# Patient Record
Sex: Female | Born: 1970 | Race: White | Hispanic: No | Marital: Married | Smoking: Never smoker
Health system: Southern US, Community
[De-identification: ages and names within clinical notes are randomized; demographics above are authoritative.]

## PROBLEM LIST (undated history)

## (undated) DIAGNOSIS — I10 Essential (primary) hypertension: Secondary | ICD-10-CM

## (undated) DIAGNOSIS — F32A Depression, unspecified: Secondary | ICD-10-CM

## (undated) DIAGNOSIS — E079 Disorder of thyroid, unspecified: Secondary | ICD-10-CM

## (undated) DIAGNOSIS — F329 Major depressive disorder, single episode, unspecified: Secondary | ICD-10-CM

## (undated) HISTORY — PX: TONSILLECTOMY: SUR1361

---

## 2005-03-14 ENCOUNTER — Emergency Department: Payer: Self-pay | Admitting: Emergency Medicine

## 2008-04-01 ENCOUNTER — Ambulatory Visit: Payer: Self-pay | Admitting: Obstetrics and Gynecology

## 2009-08-06 ENCOUNTER — Emergency Department: Payer: Self-pay | Admitting: Emergency Medicine

## 2012-08-28 ENCOUNTER — Emergency Department: Payer: Self-pay | Admitting: Emergency Medicine

## 2012-08-28 LAB — BASIC METABOLIC PANEL
BUN: 12 mg/dL (ref 7–18)
Creatinine: 0.72 mg/dL (ref 0.60–1.30)
EGFR (African American): >60
EGFR (Non-African Amer.): >60
Glucose: 103 mg/dL — ABNORMAL HIGH (ref 65–99)
Osmolality: 281 (ref 275–301)
Potassium: 3.4 mmol/L — ABNORMAL LOW (ref 3.5–5.1)
Sodium: 141 mmol/L (ref 136–145)

## 2012-08-28 LAB — CBC
HCT: 40.3 % (ref 35.0–47.0)
MCHC: 33.8 g/dL (ref 32.0–36.0)
RDW: 13 % (ref 11.5–14.5)
WBC: 9.3 10*3/uL (ref 3.6–11.0)

## 2012-11-30 ENCOUNTER — Ambulatory Visit: Payer: Self-pay | Admitting: Internal Medicine

## 2013-05-01 ENCOUNTER — Ambulatory Visit: Payer: Self-pay | Admitting: Obstetrics and Gynecology

## 2014-05-15 ENCOUNTER — Ambulatory Visit: Payer: Self-pay

## 2015-04-23 ENCOUNTER — Other Ambulatory Visit: Payer: Self-pay | Admitting: Obstetrics and Gynecology

## 2015-04-23 DIAGNOSIS — Z1231 Encounter for screening mammogram for malignant neoplasm of breast: Secondary | ICD-10-CM

## 2015-05-18 ENCOUNTER — Ambulatory Visit
Admission: RE | Admit: 2015-05-18 | Discharge: 2015-05-18 | Disposition: A | Discharge status: Home / Self Care | Payer: 59 | Source: Ambulatory Visit | Attending: Obstetrics and Gynecology | Admitting: Obstetrics and Gynecology

## 2015-05-18 DIAGNOSIS — Z1231 Encounter for screening mammogram for malignant neoplasm of breast: Secondary | ICD-10-CM | POA: Insufficient documentation

## 2016-03-16 ENCOUNTER — Emergency Department
Admission: EM | Admit: 2016-03-16 | Discharge: 2016-03-16 | Disposition: A | Discharge status: Home / Self Care | Payer: 59 | Attending: Emergency Medicine | Admitting: Emergency Medicine

## 2016-03-16 ENCOUNTER — Emergency Department: Payer: 59

## 2016-03-16 ENCOUNTER — Encounter: Payer: Self-pay | Admitting: Emergency Medicine

## 2016-03-16 DIAGNOSIS — K625 Hemorrhage of anus and rectum: Secondary | ICD-10-CM | POA: Diagnosis present

## 2016-03-16 DIAGNOSIS — F329 Major depressive disorder, single episode, unspecified: Secondary | ICD-10-CM | POA: Diagnosis not present

## 2016-03-16 DIAGNOSIS — K529 Noninfective gastroenteritis and colitis, unspecified: Secondary | ICD-10-CM | POA: Insufficient documentation

## 2016-03-16 DIAGNOSIS — I10 Essential (primary) hypertension: Secondary | ICD-10-CM | POA: Insufficient documentation

## 2016-03-16 HISTORY — DX: Disorder of thyroid, unspecified: E07.9

## 2016-03-16 HISTORY — DX: Essential (primary) hypertension: I10

## 2016-03-16 HISTORY — DX: Major depressive disorder, single episode, unspecified: F32.9

## 2016-03-16 HISTORY — DX: Depression, unspecified: F32.A

## 2016-03-16 LAB — COMPREHENSIVE METABOLIC PANEL
ALT: 24 U/L (ref 14–54)
AST: 22 U/L (ref 15–41)
Albumin: 4.3 g/dL (ref 3.5–5.0)
Alkaline Phosphatase: 51 U/L (ref 38–126)
Anion gap: 7 (ref 5–15)
BUN: 15 mg/dL (ref 6–20)
CHLORIDE: 104 mmol/L (ref 101–111)
CO2: 27 mmol/L (ref 22–32)
Calcium: 9 mg/dL (ref 8.9–10.3)
Creatinine, Ser: 0.85 mg/dL (ref 0.44–1.00)
GFR calc Af Amer: >60 mL/min (ref >60)
Glucose, Bld: 130 mg/dL — ABNORMAL HIGH (ref 65–99)
POTASSIUM: 3.1 mmol/L — AB (ref 3.5–5.1)
SODIUM: 138 mmol/L (ref 135–145)
Total Bilirubin: 1.1 mg/dL (ref 0.3–1.2)
Total Protein: 7.7 g/dL (ref 6.5–8.1)

## 2016-03-16 LAB — CBC
HEMATOCRIT: 43.4 % (ref 35.0–47.0)
Hemoglobin: 14.5 g/dL (ref 12.0–16.0)
MCH: 28.3 pg (ref 26.0–34.0)
MCHC: 33.3 g/dL (ref 32.0–36.0)
MCV: 85 fL (ref 80.0–100.0)
Platelets: 205 10*3/uL (ref 150–440)
RBC: 5.11 MIL/uL (ref 3.80–5.20)
RDW: 13.2 % (ref 11.5–14.5)
WBC: 11.2 10*3/uL — AB (ref 3.6–11.0)

## 2016-03-16 LAB — TYPE AND SCREEN
ABO/RH(D): O NEG
ANTIBODY SCREEN: NEGATIVE

## 2016-03-16 LAB — ABO/RH: ABO/RH(D): O NEG

## 2016-03-16 MED ORDER — METRONIDAZOLE 500 MG PO TABS: 500.0000 mg | ORAL_TABLET | Freq: Three times a day (TID) | ORAL | Status: AC

## 2016-03-16 MED ORDER — IOPAMIDOL (ISOVUE-300) INJECTION 61%
100.0000 mL | Freq: Once | INTRAVENOUS | Status: AC | PRN
  Administered 2016-03-16: 100 mL via INTRAVENOUS

## 2016-03-16 MED ORDER — CIPROFLOXACIN HCL 500 MG PO TABS: 500.0000 mg | ORAL_TABLET | Freq: Two times a day (BID) | ORAL | Status: AC

## 2016-03-16 MED ORDER — DIATRIZOATE MEGLUMINE & SODIUM 66-10 % PO SOLN
15.0000 mL | Freq: Once | ORAL | Status: AC
  Administered 2016-03-16: 15 mL via ORAL

## 2016-03-16 NOTE — Discharge Instructions (Signed)
Please seek medical attention for any high fevers, chest pain, shortness of breath, change in behavior, persistent vomiting, bloody stool or any other new or concerning symptoms.   Colitis Colitis is inflammation of the colon. Colitis may last a short time (acute) or it may last a long time (chronic). CAUSES This condition may be caused by:  Viruses.  Bacteria.  Reactions to medicine.  Certain autoimmune diseases, such as Crohn disease or ulcerative colitis. SYMPTOMS Symptoms of this condition include:  Diarrhea.  Passing bloody or tarry stool.  Pain.  Fever.  Vomiting.  Tiredness (fatigue).  Weight loss.  Bloating.  Sudden increase in abdominal pain.  Having fewer bowel movements than usual. DIAGNOSIS This condition is diagnosed with a stool test or a blood test. You may also have other tests, including X-rays, a CT scan, or a colonoscopy. TREATMENT Treatment may include:  Resting the bowel. This involves not eating or drinking for a period of time.  Fluids that are given through an IV tube.  Medicine for pain and diarrhea.  Antibiotic medicines.  Cortisone medicines.  Surgery. HOME CARE INSTRUCTIONS Eating and Drinking  Follow instructions from your health care provider about eating or drinking restrictions.  Drink enough fluid to keep your urine clear or pale yellow.  Work with a dietitian to determine which foods cause your condition to flare up.  Avoid foods that cause flare-ups.  Eat a well-balanced diet. Medicines  Take over-the-counter and prescription medicines only as told by your health care provider.  If you were prescribed an antibiotic medicine, take it as told by your health care provider. Do not stop taking the antibiotic even if you start to feel better. General Instructions  Keep all follow-up visits as told by your health care provider. This is important. SEEK MEDICAL CARE IF:  Your symptoms do not go away.  You develop  new symptoms. SEEK IMMEDIATE MEDICAL CARE IF:  You have a fever that does not go away with treatment.  You develop chills.  You have extreme weakness, fainting, or dehydration.  You have repeated vomiting.  You develop severe pain in your abdomen.  You pass bloody or tarry stool.   This information is not intended to replace advice given to you by your health care provider. Make sure you discuss any questions you have with your health care provider.   Document Released: 11/24/2004 Document Revised: 07/08/2015 Document Reviewed: 02/09/2015 Elsevier Interactive Patient Education [employer].

## 2016-03-16 NOTE — ED Notes (Signed)
Pt presents to ED with reports of rectal bleeding that started this morning. Pt reports had diarrhea yesterday. Pt denies diarrhea today. Pt reports feels abdominal cramping and feels like she is going to have a bowel movement but when she goes to the toilet she is passing blood and blood clots.

## 2016-03-16 NOTE — ED Provider Notes (Signed)
[facility] Emergency Department Provider Note    ____________________________________________  Time seen: ~2135  I have reviewed the triage vital signs and the nursing notes.   HISTORY  Chief Complaint Rectal Bleeding   History limited by: Not Limited   HPI Joanna Ford is a 44 y.o. female who presents to the emergency department today because of concerns for GI bleeding and diarrhea as well as abdominal pain. Patient states that the symptoms started in the middle of last night. She states she woke up with abdominal cramping. She describes it as being located in the lower part of her abdomen. It is also somewhat slightly more on the left lower quadrant. The patient states that after waking up she did have diarrhea. She states that she noticed some blood in the diarrhea. Since that time he has had multiple episodes of diarrhea and has passed some blood clots. She describes it as being red blood. She is continuing with some abdominal pain. Patient denies similar symptoms in the past. Denies any fevers. Denies any history of colonoscopy. No family history of intestinal issues. No recent travel. No abnormal ingestion. No untreated water.   Past Medical History  Diagnosis Date  . Thyroid disease   . Hypertension   . Depression     There are no active problems to display for this patient.   Past Surgical History  Procedure Laterality Date  . Tonsillectomy      No current outpatient prescriptions on file.  Allergies Review of patient's allergies indicates no known allergies.  No family history on file.  Social History Social History  Substance Use Topics  . Smoking status: Never Smoker   . Smokeless tobacco: None  . Alcohol Use: No    Review of Systems  Constitutional: Negative for fever. Cardiovascular: Negative for chest pain. Respiratory: Negative for shortness of breath. Gastrointestinal: Negative for abdominal pain, vomiting and  diarrhea. Genitourinary: Negative for dysuria. Musculoskeletal: Negative for back pain. Skin: Negative for rash. Neurological: Negative for headaches, focal weakness or numbness.  10-point ROS otherwise negative.  ____________________________________________   PHYSICAL EXAM:  VITAL SIGNS: ED Triage Vitals  Enc Vitals Group     BP 03/16/16 1802 138/67 mmHg     Pulse Rate 03/16/16 1802 88     Resp 03/16/16 1802 18     Temp 03/16/16 1802 98.5 F (36.9 C)     Temp Source 03/16/16 1802 Oral     SpO2 03/16/16 1802 100 %     Weight 03/16/16 1802 202 lb (91.627 kg)     Height 03/16/16 1802 5\' 5"  (1.651 m)   Constitutional: Alert and oriented. Well appearing and in no distress. Eyes: Conjunctivae are normal. PERRL. Normal extraocular movements. ENT   Head: Normocephalic and atraumatic.   Nose: No congestion/rhinnorhea.   Mouth/Throat: Mucous membranes are moist.   Neck: No stridor. Hematological/Lymphatic/Immunilogical: No cervical lymphadenopathy. Cardiovascular: Normal rate, regular rhythm.  No murmurs, rubs, or gallops. Respiratory: Normal respiratory effort without tachypnea nor retractions. Breath sounds are clear and equal bilaterally. No wheezes/rales/rhonchi. Gastrointestinal: Soft and tender to palpation in the left lower quadrant. Genitourinary: Deferred Musculoskeletal: Normal range of motion in all extremities. No joint effusions.  No lower extremity tenderness nor edema. Neurologic:  Normal speech and language. No gross focal neurologic deficits are appreciated.  Skin:  Skin is warm, dry and intact. No rash noted. Psychiatric: Mood and affect are normal. Speech and behavior are normal. Patient exhibits appropriate insight and judgment.  ____________________________________________  LABS (pertinent positives/negatives)  Labs Reviewed  COMPREHENSIVE METABOLIC PANEL - Abnormal; Notable for the following:    Potassium 3.1 (*)    Glucose, Bld 130 (*)     All other components within normal limits  CBC - Abnormal; Notable for the following:    WBC 11.2 (*)    All other components within normal limits  POC OCCULT BLOOD, ED  TYPE AND SCREEN  ABO/RH     ____________________________________________   EKG  None  ____________________________________________    RADIOLOGY  CT abd/pel  IMPRESSION: Colitis of the descending colon. No abscess.   ____________________________________________   PROCEDURES  Procedure(s) performed: None  Critical Care performed: No  ____________________________________________   INITIAL IMPRESSION / ASSESSMENT AND PLAN / ED COURSE  Pertinent labs & imaging results that were available during my care of the patient were reviewed by me and considered in my medical decision making (see chart for details).  Patient presented to the emergency department today because of concerns for diarrhea and bloody diarrhea. Patient also had some tenderness in the left lower quadrant. It shows mild leukocytosis on blood work. Because of this a CT was obtained to evaluate for possible diverticulitis. CT scan does show colitis. Will plan on placing patient on antibiotics. Discussed findings with patient.  ____________________________________________   FINAL CLINICAL IMPRESSION(S) / ED DIAGNOSES  Final diagnoses:  Colitis     Nance Pear, MD 03/16/16 2329

## 2016-04-27 ENCOUNTER — Other Ambulatory Visit: Payer: Self-pay | Admitting: Obstetrics and Gynecology

## 2016-04-27 DIAGNOSIS — Z1231 Encounter for screening mammogram for malignant neoplasm of breast: Secondary | ICD-10-CM

## 2016-05-06 ENCOUNTER — Ambulatory Visit: Payer: 59

## 2016-05-18 ENCOUNTER — Other Ambulatory Visit: Payer: Self-pay | Admitting: Obstetrics and Gynecology

## 2016-05-18 ENCOUNTER — Ambulatory Visit
Admission: RE | Admit: 2016-05-18 | Discharge: 2016-05-18 | Disposition: A | Discharge status: Home / Self Care | Payer: 59 | Source: Ambulatory Visit | Attending: Obstetrics and Gynecology | Admitting: Obstetrics and Gynecology

## 2016-05-18 DIAGNOSIS — Z1231 Encounter for screening mammogram for malignant neoplasm of breast: Secondary | ICD-10-CM

## 2017-05-01 ENCOUNTER — Other Ambulatory Visit: Payer: Self-pay | Admitting: Obstetrics and Gynecology

## 2017-05-01 DIAGNOSIS — Z1231 Encounter for screening mammogram for malignant neoplasm of breast: Secondary | ICD-10-CM

## 2017-05-24 ENCOUNTER — Ambulatory Visit
Admission: RE | Admit: 2017-05-24 | Discharge: 2017-05-24 | Disposition: A | Discharge status: Home / Self Care | Payer: 59 | Source: Ambulatory Visit | Attending: Obstetrics and Gynecology | Admitting: Obstetrics and Gynecology

## 2017-05-24 DIAGNOSIS — Z1231 Encounter for screening mammogram for malignant neoplasm of breast: Secondary | ICD-10-CM

## 2017-05-25 IMAGING — CT CT ABD-PELV W/ CM
2 of 5 series · 16 of 46 positions shown, 18 images · IV contrast (iopamidol)
Comparison: None.

CLINICAL DATA: 44-year-old female with left lower quadrant
abdominal pain and rectal bleeding

EXAM:
CT ABDOMEN AND PELVIS WITH CONTRAST
TECHNIQUE: Multidetector CT imaging of the abdomen and pelvis was performed
using the standard protocol following bolus administration of
intravenous contrast.
CONTRAST:  100mL 5GG31A-QWW IOPAMIDOL (5GG31A-QWW) INJECTION 61%

[Series 2: routine abd pel with · axial · 0.80mm/px · z∈[-942,-532]mm · 13 of 92 slices shown, 15 images]
[im 5/92  soft-tissue]
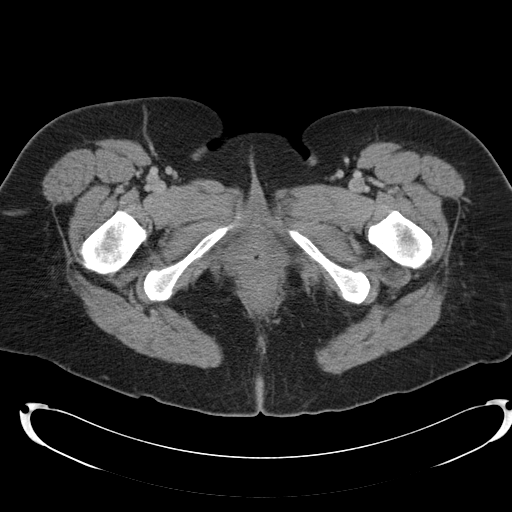
[im 5/92  bone]
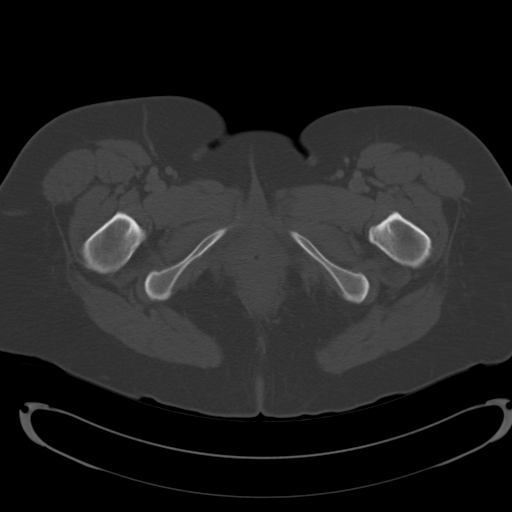
[im 15/92  soft-tissue]
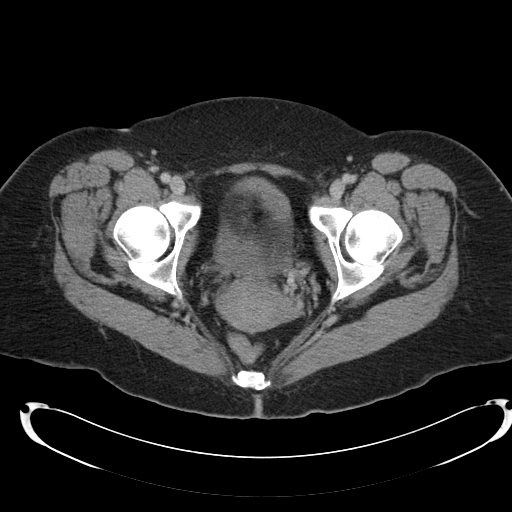
[im 20/92  soft-tissue]
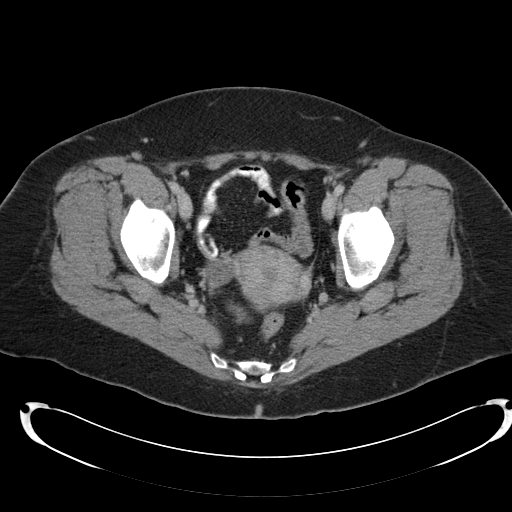
[im 24/92  soft-tissue]
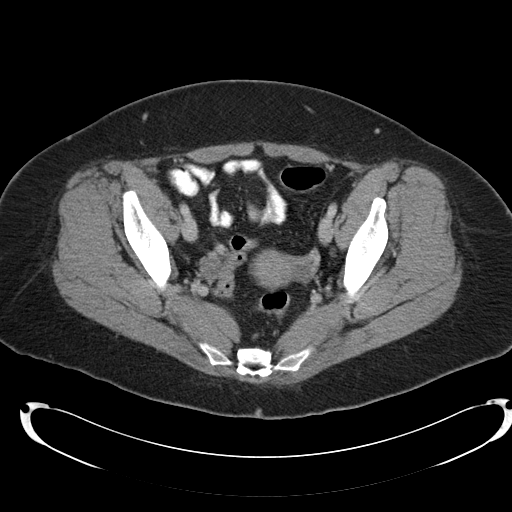
[im 34/92  soft-tissue]
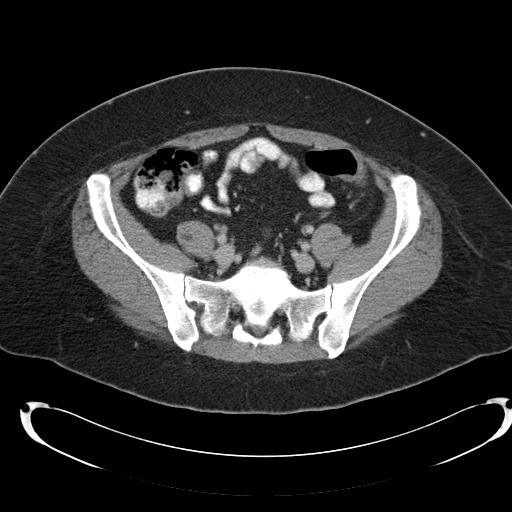
[im 39/92  soft-tissue]
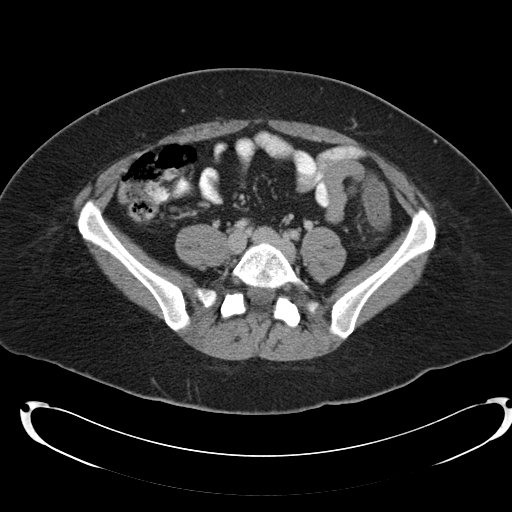
[im 48/92  soft-tissue]
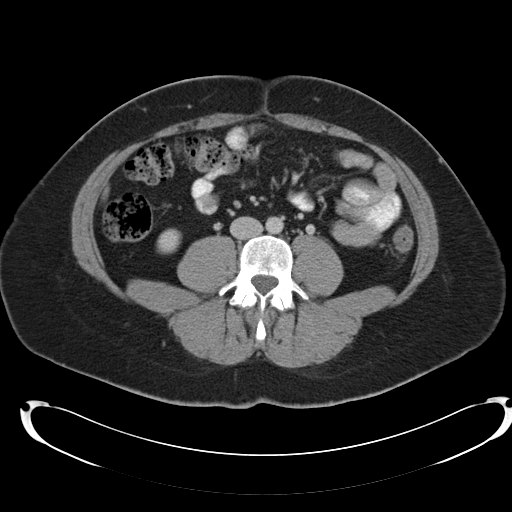
[im 53/92  soft-tissue]
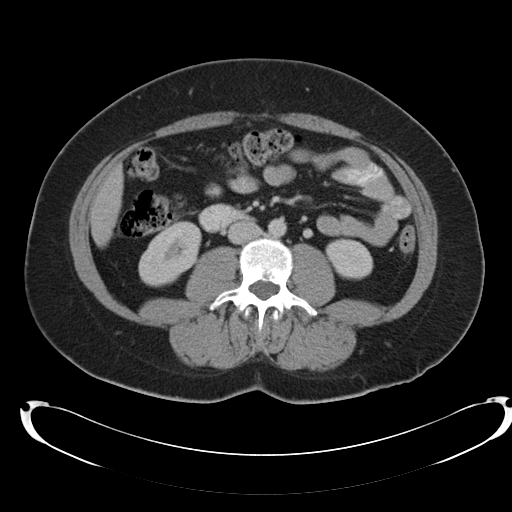
[im 58/92  soft-tissue]
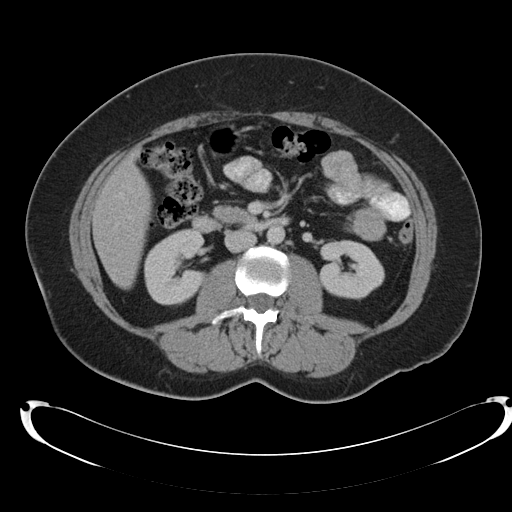
[im 58/92  bone]
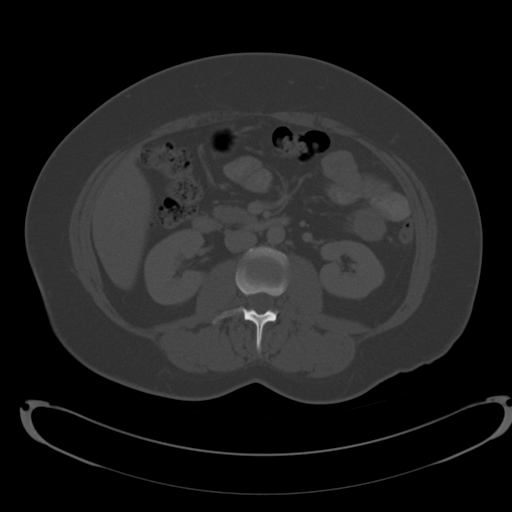
[im 68/92  soft-tissue]
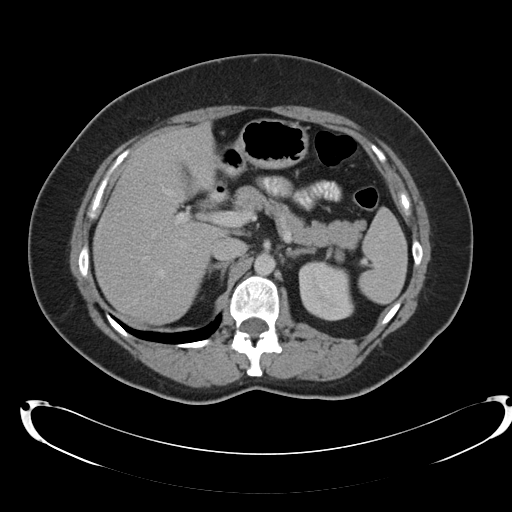
[im 72/92  soft-tissue]
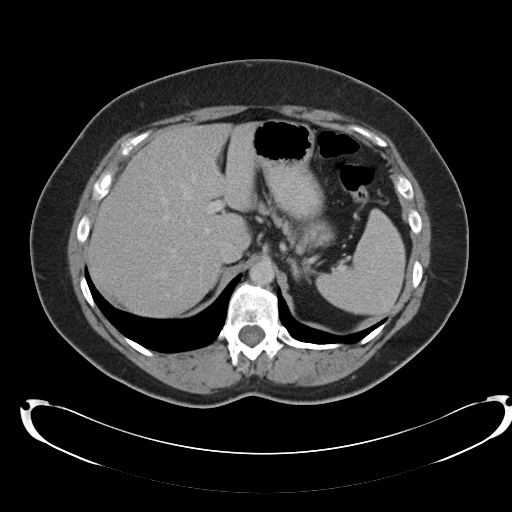
[im 77/92  soft-tissue]
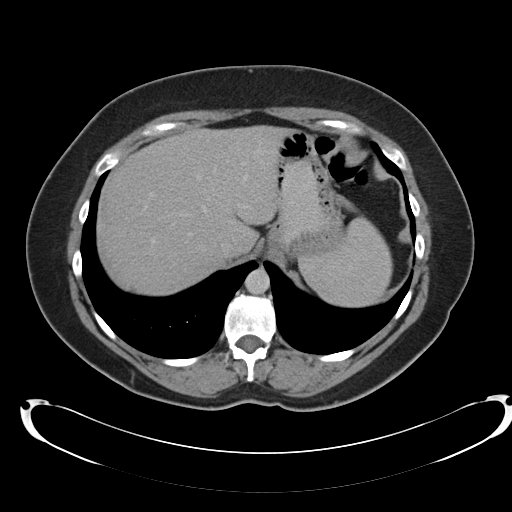
[im 87/92  soft-tissue]
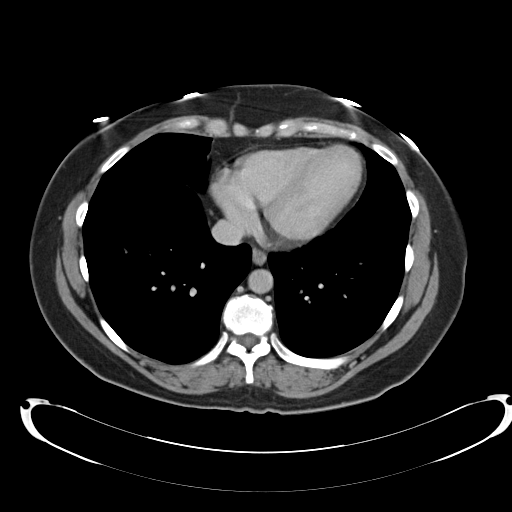

[Series 5: cor routine abd pel with · coronal · 0.77mm/px · 3 of 137 slices shown]
[im 46/137  soft-tissue]
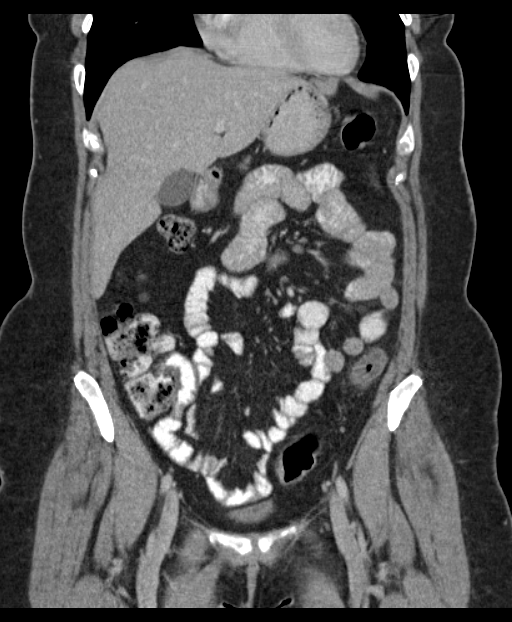
[im 61/137  soft-tissue]
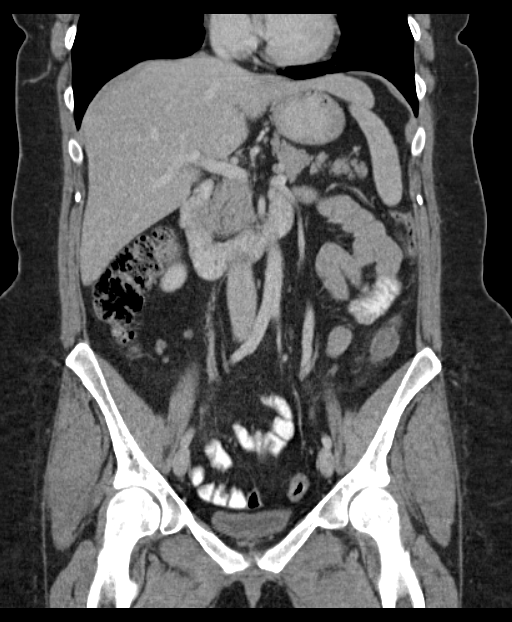
[im 76/137  soft-tissue]
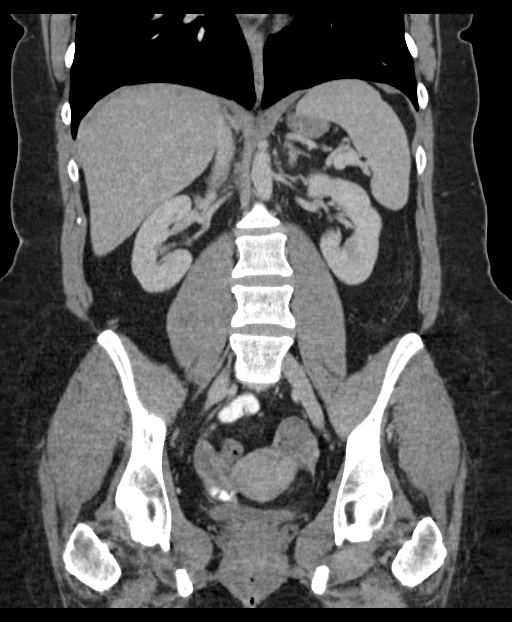

[16 of 46 positions shown; findings below may reference images not displayed]

FINDINGS: The visualized lung bases are clear. No intra-abdominal free air or
free fluid.

The liver, gallbladder, pancreas, spleen, adrenal glands, kidneys,
visualized ureters appear unremarkable. The urinary bladder is
collapsed. There is apparent diffuse thickening of the bladder wall
which may be partly related to underdistention. Cystitis is not
excluded. Correlation with urinalysis recommended. The uterus and
ovaries are grossly unremarkable.

There is circumferential thickening of the mid and distal portion of
the descending colon compatible with colitis. Correlation with stool
cultures recommended. Moderate stool noted in the proximal colon.
There is no evidence of bowel obstruction. Normal appendix.

The abdominal aorta and IVC appear unremarkable. The origins of the
celiac axis, SMA, IMA as well as the origins of the renal arteries
are patent. No portal venous gas identified. There is no adenopathy.
There is a midline vertical anterior abdominal wall incisional scar
and a small fat containing umbilical hernia. The abdominal wall soft
tissues are otherwise unremarkable. The osseous structures are
intact.
IMPRESSION: Colitis of the descending colon.  No abscess.

## 2018-05-02 ENCOUNTER — Other Ambulatory Visit: Payer: Self-pay | Admitting: Obstetrics and Gynecology

## 2018-05-02 DIAGNOSIS — Z1231 Encounter for screening mammogram for malignant neoplasm of breast: Secondary | ICD-10-CM

## 2018-05-30 ENCOUNTER — Ambulatory Visit
Admission: RE | Admit: 2018-05-30 | Discharge: 2018-05-30 | Disposition: A | Discharge status: Home / Self Care | Payer: BLUE CROSS/BLUE SHIELD | Source: Ambulatory Visit | Attending: Obstetrics and Gynecology | Admitting: Obstetrics and Gynecology

## 2018-05-30 DIAGNOSIS — Z1231 Encounter for screening mammogram for malignant neoplasm of breast: Secondary | ICD-10-CM | POA: Diagnosis not present

## 2019-05-06 ENCOUNTER — Other Ambulatory Visit: Payer: Self-pay | Admitting: Family Medicine

## 2019-05-06 ENCOUNTER — Other Ambulatory Visit: Payer: Self-pay | Admitting: Obstetrics and Gynecology

## 2019-05-06 DIAGNOSIS — Z1231 Encounter for screening mammogram for malignant neoplasm of breast: Secondary | ICD-10-CM

## 2019-05-29 ENCOUNTER — Other Ambulatory Visit: Payer: Self-pay | Admitting: Internal Medicine

## 2019-05-29 DIAGNOSIS — R10811 Right upper quadrant abdominal tenderness: Secondary | ICD-10-CM

## 2019-06-05 ENCOUNTER — Other Ambulatory Visit: Payer: Self-pay

## 2019-06-05 ENCOUNTER — Ambulatory Visit
Admission: RE | Admit: 2019-06-05 | Discharge: 2019-06-05 | Disposition: A | Discharge status: Home / Self Care | Payer: BC Managed Care – PPO | Source: Ambulatory Visit | Attending: Internal Medicine | Admitting: Internal Medicine

## 2019-06-05 DIAGNOSIS — R10811 Right upper quadrant abdominal tenderness: Secondary | ICD-10-CM | POA: Insufficient documentation

## 2019-06-13 ENCOUNTER — Ambulatory Visit
Admission: RE | Admit: 2019-06-13 | Discharge: 2019-06-13 | Disposition: A | Discharge status: Home / Self Care | Payer: BC Managed Care – PPO | Source: Ambulatory Visit | Attending: Obstetrics and Gynecology | Admitting: Obstetrics and Gynecology

## 2019-06-13 DIAGNOSIS — Z1231 Encounter for screening mammogram for malignant neoplasm of breast: Secondary | ICD-10-CM | POA: Diagnosis not present

## 2019-06-14 ENCOUNTER — Other Ambulatory Visit: Payer: Self-pay | Admitting: Internal Medicine

## 2019-06-14 DIAGNOSIS — R10811 Right upper quadrant abdominal tenderness: Secondary | ICD-10-CM

## 2019-06-20 ENCOUNTER — Encounter
Admission: RE | Admit: 2019-06-20 | Discharge: 2019-06-20 | Disposition: A | Discharge status: Home / Self Care | Payer: BC Managed Care – PPO | Source: Ambulatory Visit | Attending: Internal Medicine | Admitting: Internal Medicine

## 2019-06-20 ENCOUNTER — Other Ambulatory Visit: Payer: Self-pay

## 2019-06-20 DIAGNOSIS — R10811 Right upper quadrant abdominal tenderness: Secondary | ICD-10-CM | POA: Insufficient documentation

## 2019-06-20 MED ORDER — TECHNETIUM TC 99M MEBROFENIN IV KIT
5.0000 | PACK | Freq: Once | INTRAVENOUS | Status: AC | PRN
  Administered 2019-06-20: 08:00:00 5.52 via INTRAVENOUS

## 2019-09-20 ENCOUNTER — Emergency Department: Payer: BC Managed Care – PPO

## 2019-09-20 ENCOUNTER — Other Ambulatory Visit: Payer: Self-pay

## 2019-09-20 DIAGNOSIS — W230XXA Caught, crushed, jammed, or pinched between moving objects, initial encounter: Secondary | ICD-10-CM | POA: Diagnosis not present

## 2019-09-20 DIAGNOSIS — I10 Essential (primary) hypertension: Secondary | ICD-10-CM | POA: Diagnosis not present

## 2019-09-20 DIAGNOSIS — Y9289 Other specified places as the place of occurrence of the external cause: Secondary | ICD-10-CM | POA: Diagnosis not present

## 2019-09-20 DIAGNOSIS — S6991XA Unspecified injury of right wrist, hand and finger(s), initial encounter: Secondary | ICD-10-CM | POA: Insufficient documentation

## 2019-09-20 DIAGNOSIS — S60111A Contusion of right thumb with damage to nail, initial encounter: Secondary | ICD-10-CM | POA: Diagnosis not present

## 2019-09-20 DIAGNOSIS — Y999 Unspecified external cause status: Secondary | ICD-10-CM | POA: Insufficient documentation

## 2019-09-20 DIAGNOSIS — Y9389 Activity, other specified: Secondary | ICD-10-CM | POA: Insufficient documentation

## 2019-09-20 NOTE — ED Triage Notes (Signed)
Patient reports she slammed her right hand in car door. Bleeding seen to base of thumbnail. Bruising seen to distal thumb.

## 2019-09-21 ENCOUNTER — Emergency Department
Admission: EM | Admit: 2019-09-21 | Discharge: 2019-09-21 | Disposition: A | Discharge status: Home / Self Care | Payer: BC Managed Care – PPO | Attending: Emergency Medicine | Admitting: Emergency Medicine

## 2019-09-21 DIAGNOSIS — S6991XA Unspecified injury of right wrist, hand and finger(s), initial encounter: Secondary | ICD-10-CM

## 2019-09-21 MED ORDER — IBUPROFEN 600 MG PO TABS: 600.0000 mg | ORAL_TABLET | Freq: Four times a day (QID) | ORAL | 0 refills | Status: DC | PRN

## 2019-09-21 NOTE — ED Notes (Signed)
Patient states that she slammed her right thumb in a car door tonight. Patient with bruising and swelling noted thumb. Patient with blood noted to the bottom of nail with bleeding controlled at this time.

## 2019-09-21 NOTE — ED Provider Notes (Signed)
[facility] Emergency Department Provider Note   ____________________________________________   I have reviewed the triage vital signs and the nursing notes.   HISTORY  Chief Complaint Finger Injury   History limited by: Not Limited   HPI SUE-ELLEN MELMS is a 48 y.o. female who presents to the emergency department today because of concern for right thumb injury. She got her finger slammed in a car door. She denies any other injury. Has had swelling to that thumb as well as decreased ROM. She is right handed.    Records reviewed. Per medical record review patient has a history of HTN  Past Medical History:  Diagnosis Date  . Depression   . Hypertension   . Thyroid disease     There are no active problems to display for this patient.   Past Surgical History:  Procedure Laterality Date  . TONSILLECTOMY      Prior to Admission medications   Not on File    Allergies Patient has no known allergies.  Family History  Problem Relation Age of Onset  . Breast cancer Neg Hx     Social History Social History   Tobacco Use  . Smoking status: Never Smoker  . Smokeless tobacco: Never Used  Substance Use Topics  . Alcohol use: No  . Drug use: No    Review of Systems Constitutional: No fever/chills Eyes: No visual changes. ENT: No sore throat. Cardiovascular: Denies chest pain. Respiratory: Denies shortness of breath. Gastrointestinal: No abdominal pain.  No nausea, no vomiting.  No diarrhea.   Genitourinary: Negative for dysuria. Musculoskeletal: Positive for right thumb pain and swelling.  Skin: Bruising to right thumb.  Neurological: Negative for headaches, focal weakness or numbness.  ____________________________________________   PHYSICAL EXAM:  VITAL SIGNS: ED Triage Vitals [09/20/19 2339]  Enc Vitals Group     BP (!) 152/74     Pulse Rate 83     Resp 17     Temp 98.7 F (37.1 C)     Temp Source Oral     SpO2 97 %   Weight 213 lb (96.6 kg)     Height 5\' 6"  (1.676 m)     Head Circumference      Peak Flow      Pain Score 6   Constitutional: Alert and oriented.  Eyes: Conjunctivae are normal.  ENT      Head: Normocephalic and atraumatic.      Nose: No congestion/rhinnorhea.      Mouth/Throat: Mucous membranes are moist.      Neck: No stridor. Cardiovascular: Normal rate, regular rhythm.  No murmurs, rubs, or gallops.  Respiratory: Normal respiratory effort without tachypnea nor retractions. Breath sounds are clear and equal bilaterally. No wheezes/rales/rhonchi. Gastrointestinal: Soft and non tender. No rebound. No guarding.  Genitourinary: Deferred Musculoskeletal: Right thumb with swelling and bruising. Small subungual hematoma. Small laceration to nail fold Neurologic:  Normal speech and language. No gross focal neurologic deficits are appreciated.  Skin:  Skin is warm, dry and intact. No rash noted. Psychiatric: Mood and affect are normal. Speech and behavior are normal. Patient exhibits appropriate insight and judgment.  ____________________________________________    LABS (pertinent positives/negatives)  None  ____________________________________________   EKG  None  ____________________________________________    RADIOLOGY  Right thumb No acute osseous injury  ____________________________________________   PROCEDURES  Procedures  ____________________________________________   INITIAL IMPRESSION / ASSESSMENT AND PLAN / ED COURSE  Pertinent labs & imaging results that were available during  my care of the patient were reviewed by me and considered in my medical decision making (see chart for details).   Patient presents to the emergency department because of concern for right thumb injury. X-ray does not show any fracture. Small subungual hematoma. Do not feel there would be significant benefit with trephination, however did discuss it with patient. She felt comfortable  deferring at this time. Small laceration to nail fold not requiring advanced closure. Will discharge home.   ____________________________________________   FINAL CLINICAL IMPRESSION(S) / ED DIAGNOSES  Final diagnoses:  Injury of finger of right hand, initial encounter     Note: This dictation was prepared with Dragon dictation. Any transcriptional errors that result from this process are unintentional     Nance Pear, MD 09/21/19 0401

## 2019-09-21 NOTE — Discharge Instructions (Signed)
Please try to keep your finger clean. The main concern would be for an infection, so if there is increased redness, foul smelling discharge or any pus please be seen.

## 2019-09-21 NOTE — ED Notes (Signed)
ED Provider at bedside. 

## 2020-05-06 ENCOUNTER — Other Ambulatory Visit: Payer: Self-pay | Admitting: Obstetrics and Gynecology

## 2020-05-06 DIAGNOSIS — Z1231 Encounter for screening mammogram for malignant neoplasm of breast: Secondary | ICD-10-CM

## 2020-06-15 ENCOUNTER — Other Ambulatory Visit: Payer: Self-pay

## 2020-06-15 ENCOUNTER — Ambulatory Visit
Admission: RE | Admit: 2020-06-15 | Discharge: 2020-06-15 | Disposition: A | Discharge status: Home / Self Care | Payer: BC Managed Care – PPO | Source: Ambulatory Visit | Attending: Obstetrics and Gynecology | Admitting: Obstetrics and Gynecology

## 2020-06-15 DIAGNOSIS — Z1231 Encounter for screening mammogram for malignant neoplasm of breast: Secondary | ICD-10-CM | POA: Diagnosis present

## 2020-09-23 ENCOUNTER — Other Ambulatory Visit: Payer: Self-pay | Admitting: Family Medicine

## 2020-09-23 DIAGNOSIS — M5441 Lumbago with sciatica, right side: Secondary | ICD-10-CM

## 2020-12-03 ENCOUNTER — Other Ambulatory Visit: Payer: Self-pay | Admitting: Gastroenterology

## 2020-12-03 DIAGNOSIS — R1902 Left upper quadrant abdominal swelling, mass and lump: Secondary | ICD-10-CM

## 2020-12-16 ENCOUNTER — Ambulatory Visit
Admission: RE | Admit: 2020-12-16 | Discharge: 2020-12-16 | Disposition: A | Discharge status: Home / Self Care | Payer: BC Managed Care – PPO | Source: Ambulatory Visit | Attending: Gastroenterology | Admitting: Gastroenterology

## 2020-12-16 ENCOUNTER — Other Ambulatory Visit: Payer: Self-pay

## 2020-12-16 DIAGNOSIS — R1902 Left upper quadrant abdominal swelling, mass and lump: Secondary | ICD-10-CM | POA: Insufficient documentation

## 2020-12-16 LAB — POCT I-STAT CREATININE: Creatinine, Ser: 0.9 mg/dL (ref 0.44–1.00)

## 2020-12-16 MED ORDER — IOHEXOL 300 MG/ML  SOLN
100.0000 mL | Freq: Once | INTRAMUSCULAR | Status: AC | PRN
  Administered 2020-12-16: 100 mL via INTRAVENOUS

## 2021-05-19 ENCOUNTER — Other Ambulatory Visit: Payer: Self-pay | Admitting: Physical Medicine & Rehabilitation

## 2021-05-19 DIAGNOSIS — G8929 Other chronic pain: Secondary | ICD-10-CM

## 2021-05-25 ENCOUNTER — Ambulatory Visit
Admission: RE | Admit: 2021-05-25 | Discharge: 2021-05-25 | Disposition: A | Discharge status: Home / Self Care | Payer: BC Managed Care – PPO | Source: Ambulatory Visit | Attending: Physical Medicine & Rehabilitation | Admitting: Physical Medicine & Rehabilitation

## 2021-05-25 ENCOUNTER — Other Ambulatory Visit: Payer: Self-pay

## 2021-05-25 DIAGNOSIS — G8929 Other chronic pain: Secondary | ICD-10-CM | POA: Insufficient documentation

## 2021-05-25 DIAGNOSIS — M5441 Lumbago with sciatica, right side: Secondary | ICD-10-CM | POA: Insufficient documentation

## 2021-06-30 ENCOUNTER — Other Ambulatory Visit: Payer: Self-pay | Admitting: Obstetrics and Gynecology

## 2021-06-30 DIAGNOSIS — Z1231 Encounter for screening mammogram for malignant neoplasm of breast: Secondary | ICD-10-CM

## 2021-07-16 ENCOUNTER — Other Ambulatory Visit: Payer: Self-pay

## 2021-07-16 ENCOUNTER — Ambulatory Visit
Admission: RE | Admit: 2021-07-16 | Discharge: 2021-07-16 | Disposition: A | Discharge status: Home / Self Care | Payer: BC Managed Care – PPO | Source: Ambulatory Visit | Attending: Obstetrics and Gynecology | Admitting: Obstetrics and Gynecology

## 2021-07-16 DIAGNOSIS — Z1231 Encounter for screening mammogram for malignant neoplasm of breast: Secondary | ICD-10-CM | POA: Insufficient documentation

## 2021-11-16 ENCOUNTER — Other Ambulatory Visit: Payer: Self-pay | Admitting: Neurosurgery

## 2021-11-16 DIAGNOSIS — M5416 Radiculopathy, lumbar region: Secondary | ICD-10-CM

## 2021-11-16 DIAGNOSIS — M7138 Other bursal cyst, other site: Secondary | ICD-10-CM

## 2021-11-25 ENCOUNTER — Ambulatory Visit
Admission: RE | Admit: 2021-11-25 | Discharge: 2021-11-25 | Disposition: A | Discharge status: Home / Self Care | Payer: BC Managed Care – PPO | Source: Ambulatory Visit | Attending: Neurosurgery | Admitting: Neurosurgery

## 2021-11-25 ENCOUNTER — Other Ambulatory Visit: Payer: Self-pay

## 2021-11-25 DIAGNOSIS — M5416 Radiculopathy, lumbar region: Secondary | ICD-10-CM | POA: Diagnosis present

## 2021-11-25 DIAGNOSIS — M7138 Other bursal cyst, other site: Secondary | ICD-10-CM | POA: Diagnosis present

## 2021-12-08 ENCOUNTER — Other Ambulatory Visit: Payer: Self-pay | Admitting: Neurosurgery

## 2021-12-21 ENCOUNTER — Other Ambulatory Visit: Payer: Self-pay

## 2021-12-21 ENCOUNTER — Encounter
Admission: RE | Admit: 2021-12-21 | Discharge: 2021-12-21 | Disposition: A | Discharge status: Home / Self Care | Payer: BC Managed Care – PPO | Source: Ambulatory Visit | Attending: Neurosurgery | Admitting: Neurosurgery

## 2021-12-21 DIAGNOSIS — Z01812 Encounter for preprocedural laboratory examination: Secondary | ICD-10-CM | POA: Insufficient documentation

## 2021-12-21 LAB — TYPE AND SCREEN
ABO/RH(D): O NEG
Antibody Screen: NEGATIVE

## 2021-12-21 LAB — BASIC METABOLIC PANEL
Anion gap: 9 (ref 5–15)
BUN: 15 mg/dL (ref 6–20)
CO2: 26 mmol/L (ref 22–32)
Calcium: 9.1 mg/dL (ref 8.9–10.3)
Chloride: 100 mmol/L (ref 98–111)
Creatinine, Ser: 0.91 mg/dL (ref 0.44–1.00)
GFR, Estimated: >60 mL/min (ref >60)
Glucose, Bld: 93 mg/dL (ref 70–99)
Potassium: 3.6 mmol/L (ref 3.5–5.1)
Sodium: 135 mmol/L (ref 135–145)

## 2021-12-21 LAB — APTT: aPTT: 32 seconds (ref 24–36)

## 2021-12-21 LAB — URINALYSIS, ROUTINE W REFLEX MICROSCOPIC
Bilirubin Urine: NEGATIVE
Glucose, UA: NEGATIVE mg/dL
Hgb urine dipstick: NEGATIVE
Ketones, ur: NEGATIVE mg/dL
Leukocytes,Ua: NEGATIVE
Nitrite: NEGATIVE
Protein, ur: NEGATIVE mg/dL
Specific Gravity, Urine: 1.011 (ref 1.005–1.030)
pH: 7 (ref 5.0–8.0)

## 2021-12-21 LAB — CBC
HCT: 45.3 % (ref 36.0–46.0)
Hemoglobin: 14.9 g/dL (ref 12.0–15.0)
MCH: 27.6 pg (ref 26.0–34.0)
MCHC: 32.9 g/dL (ref 30.0–36.0)
MCV: 84 fL (ref 80.0–100.0)
Platelets: 252 10*3/uL (ref 150–400)
RBC: 5.39 MIL/uL — ABNORMAL HIGH (ref 3.87–5.11)
RDW: 13.4 % (ref 11.5–15.5)
WBC: 8.3 10*3/uL (ref 4.0–10.5)
nRBC: 0 % (ref 0.0–0.2)

## 2021-12-21 LAB — SURGICAL PCR SCREEN
MRSA, PCR: NEGATIVE
Staphylococcus aureus: NEGATIVE

## 2021-12-21 LAB — PROTIME-INR
INR: 0.9 (ref 0.8–1.2)
Prothrombin Time: 12.5 seconds (ref 11.4–15.2)

## 2021-12-21 NOTE — Patient Instructions (Addendum)
Your procedure is scheduled on: 12/29/21 - Wednesday Report to the Registration Desk on the 1st floor of the [facility]. To find out your arrival time, please call [phone] between 1PM - 3PM on: 12/28/21 - Tuesday  REMEMBER: Instructions that are not followed completely may result in serious medical risk, up to and including death; or upon the discretion of your surgeon and anesthesiologist your surgery may need to be rescheduled.  Do not eat food after midnight the night before surgery.  No gum chewing, lozengers or hard candies.  You may however, drink CLEAR liquids up to 2 hours before you are scheduled to arrive for your surgery. Do not drink anything within 2 hours of your scheduled arrival time.  Clear liquids include: - water  - apple juice without pulp - gatorade (not RED colors) - black coffee or tea (Do NOT add milk or creamers to the coffee or tea) Do NOT drink anything that is not on this list.  TAKE THESE MEDICATIONS THE MORNING OF SURGERY WITH A SIP OF WATER:  - levothyroxine (SYNTHROID) 175 MCG tablet - pantoprazole (PROTONIX) 40 MG tablet(take one the night before and one on the morning of surgery - helps to prevent nausea after surgery.) - rOPINIRole (REQUIP) 0.5 MG tablet  One week prior to surgery: Stop Anti-inflammatories (NSAIDS) such as Advil, Aleve, Ibuprofen, Motrin, Naproxen, Naprosyn and Aspirin based products such as Excedrin, Goodys Powder, BC Powder.  Stop ANY OVER THE COUNTER supplements until after surgery.  You may take Tylenol if needed for pain up until the day of surgery.  No Alcohol for 24 hours before or after surgery.  No Smoking including e-cigarettes for 24 hours prior to surgery.  No chewable tobacco products for at least 6 hours prior to surgery.  No nicotine patches on the day of surgery.  Do not use any "recreational" drugs for at least a week prior to your surgery.  Please be advised that the combination of cocaine and  anesthesia may have negative outcomes, up to and including death. If you test positive for cocaine, your surgery will be cancelled.  On the morning of surgery brush your teeth with toothpaste and water, you may rinse your mouth with mouthwash if you wish. Do not swallow any toothpaste or mouthwash.  Use CHG Soap or wipes as directed on instruction sheet.  Do not wear jewelry, make-up, hairpins, clips or nail polish.  Do not wear lotions, powders, or perfumes.   Do not shave body from the neck down 48 hours prior to surgery just in case you cut yourself which could leave a site for infection.  Also, freshly shaved skin may become irritated if using the CHG soap.  Contact lenses, hearing aids and dentures may not be worn into surgery.  Do not bring valuables to the hospital. [facility] is not responsible for any missing/lost belongings or valuables.   Notify your doctor if there is any change in your medical condition (cold, fever, infection).  Wear comfortable clothing (specific to your surgery type) to the hospital.  After surgery, you can help prevent lung complications by doing breathing exercises.  Take deep breaths and cough every 1-2 hours. Your doctor may order a device called an Incentive Spirometer to help you take deep breaths. When coughing or sneezing, hold a pillow firmly against your incision with both hands. This is called splinting. Doing this helps protect your incision. It also decreases belly discomfort.  If you are being admitted to the hospital  overnight, leave your suitcase in the car. After surgery it may be brought to your room.  If you are being discharged the day of surgery, you will not be allowed to drive home. You will need a responsible adult (18 years or older) to drive you home and stay with you that night.   If you are taking public transportation, you will need to have a responsible adult (18 years or older) with you. Please confirm with your  physician that it is acceptable to use public transportation.   Please call the [place] [facility] at [phone] if you have any questions about these instructions.  Surgery Visitation Policy:  Patients undergoing a surgery or procedure may have one family member or support person with them as long as that person is not COVID-19 positive or experiencing its symptoms.  That person may remain in the waiting area during the procedure and may rotate out with other people.  Inpatient Visitation:    Visiting hours are 7 a.m. to 8 p.m. Up to two visitors ages 16+ are allowed at one time in a patient room. The visitors may rotate out with other people during the day. Visitors must check out when they leave, or other visitors will not be allowed. One designated support person may remain overnight. The visitor must pass COVID-19 screenings, use hand sanitizer when entering and exiting the patients room and wear a mask at all times, including in the patients room. Patients must also wear a mask when staff or their visitor are in the room. Masking is required regardless of vaccination status.

## 2021-12-28 NOTE — Anesthesia Preprocedure Evaluation (Addendum)
Anesthesia Evaluation  Patient identified by MRN, date of birth, ID band Patient awake    Reviewed: Allergy & Precautions, NPO status , Patient's Chart, lab work & pertinent test results  Airway Mallampati: II  TM Distance: >3 FB Neck ROM: full    Dental   Pulmonary sleep apnea and Continuous Positive Airway Pressure Ventilation ,    Pulmonary exam normal        Cardiovascular hypertension, Pt. on medications Normal cardiovascular exam     Neuro/Psych PSYCHIATRIC DISORDERS Depression Lumbar radiculopathy     GI/Hepatic Neg liver ROS, GERD  Controlled and Medicated,  Endo/Other  Hypothyroidism   Renal/GU      Musculoskeletal   Abdominal (+) + obese,   Peds  Hematology negative hematology ROS (+)   Anesthesia Other Findings Past Medical History: No date: Depression No date: Hypertension No date: Thyroid disease  Past Surgical History: No date: TONSILLECTOMY     Reproductive/Obstetrics negative OB ROS                            Anesthesia Physical Anesthesia Plan  ASA: 2  Anesthesia Plan: General ETT   Post-op Pain Management: Ofirmev IV (intra-op)*, Toradol IV (intra-op)* and Regional block*   Induction: Intravenous  PONV Risk Score and Plan: Ondansetron, Dexamethasone and Midazolam  Airway Management Planned: Oral ETT  Additional Equipment:   Intra-op Plan:   Post-operative Plan: Extubation in OR  Informed Consent: I have reviewed the patients History and Physical, chart, labs and discussed the procedure including the risks, benefits and alternatives for the proposed anesthesia with the patient or authorized representative who has indicated his/her understanding and acceptance.     Dental advisory given  Plan Discussed with: Anesthesiologist, CRNA and Surgeon  Anesthesia Plan Comments: (Patient consented for risks of anesthesia including but not limited to:  -  adverse reactions to medications - damage to eyes, teeth, lips or other oral mucosa - nerve damage due to positioning  - sore throat or hoarseness - Damage to heart, brain, nerves, lungs, other parts of body or loss of life  Patient voiced understanding.)       Anesthesia Quick Evaluation

## 2021-12-29 ENCOUNTER — Ambulatory Visit: Payer: BC Managed Care – PPO

## 2021-12-29 ENCOUNTER — Encounter: Payer: Self-pay | Admitting: Neurosurgery

## 2021-12-29 ENCOUNTER — Ambulatory Visit
Admission: RE | Admit: 2021-12-29 | Discharge: 2021-12-29 | Disposition: A | Discharge status: Home / Self Care | Payer: BC Managed Care – PPO | Attending: Neurosurgery | Admitting: Neurosurgery

## 2021-12-29 ENCOUNTER — Ambulatory Visit: Payer: BC Managed Care – PPO | Admitting: Anesthesiology

## 2021-12-29 ENCOUNTER — Other Ambulatory Visit: Payer: Self-pay

## 2021-12-29 ENCOUNTER — Encounter
Admission: RE | Disposition: A | Discharge status: Home / Self Care | Payer: Self-pay | Source: Home / Self Care | Attending: Neurosurgery

## 2021-12-29 ENCOUNTER — Ambulatory Visit: Payer: BC Managed Care – PPO | Admitting: Urgent Care

## 2021-12-29 DIAGNOSIS — I10 Essential (primary) hypertension: Secondary | ICD-10-CM | POA: Insufficient documentation

## 2021-12-29 DIAGNOSIS — G4730 Sleep apnea, unspecified: Secondary | ICD-10-CM | POA: Diagnosis not present

## 2021-12-29 DIAGNOSIS — M7138 Other bursal cyst, other site: Secondary | ICD-10-CM | POA: Insufficient documentation

## 2021-12-29 DIAGNOSIS — E039 Hypothyroidism, unspecified: Secondary | ICD-10-CM | POA: Diagnosis not present

## 2021-12-29 DIAGNOSIS — M5416 Radiculopathy, lumbar region: Secondary | ICD-10-CM | POA: Insufficient documentation

## 2021-12-29 DIAGNOSIS — F32A Depression, unspecified: Secondary | ICD-10-CM | POA: Insufficient documentation

## 2021-12-29 DIAGNOSIS — Z419 Encounter for procedure for purposes other than remedying health state, unspecified: Secondary | ICD-10-CM

## 2021-12-29 HISTORY — PX: LUMBAR LAMINECTOMY/DECOMPRESSION MICRODISCECTOMY: SHX5026

## 2021-12-29 LAB — POCT PREGNANCY, URINE: Preg Test, Ur: NEGATIVE

## 2021-12-29 SURGERY — LUMBAR LAMINECTOMY/DECOMPRESSION MICRODISCECTOMY 1 LEVEL
Anesthesia: General | Site: Back | Laterality: Right

## 2021-12-29 MED ORDER — BUPIVACAINE HCL (PF) 0.5 % IJ SOLN
INTRAMUSCULAR | Status: AC
  Filled 2021-12-29: qty 30

## 2021-12-29 MED ORDER — SODIUM CHLORIDE (PF) 0.9 % IJ SOLN
INTRAMUSCULAR | Status: DC | PRN
  Administered 2021-12-29: 55 mL

## 2021-12-29 MED ORDER — PROPOFOL 1000 MG/100ML IV EMUL
INTRAVENOUS | Status: AC
  Filled 2021-12-29: qty 100

## 2021-12-29 MED ORDER — 0.9 % SODIUM CHLORIDE (POUR BTL) OPTIME
TOPICAL | Status: DC | PRN
  Administered 2021-12-29: 1000 mL

## 2021-12-29 MED ORDER — METHOCARBAMOL 500 MG PO TABS
500.0000 mg | ORAL_TABLET | Freq: Four times a day (QID) | ORAL | 0 refills | Status: AC
Start: 2021-12-29 — End: ?

## 2021-12-29 MED ORDER — LIDOCAINE HCL (CARDIAC) PF 100 MG/5ML IV SOSY
PREFILLED_SYRINGE | INTRAVENOUS | Status: DC | PRN
Start: 2021-12-29 — End: 2021-12-29
  Administered 2021-12-29: 100 mg via INTRAVENOUS

## 2021-12-29 MED ORDER — BUPIVACAINE LIPOSOME 1.3 % IJ SUSP
INTRAMUSCULAR | Status: AC
  Filled 2021-12-29: qty 20

## 2021-12-29 MED ORDER — CEFAZOLIN SODIUM-DEXTROSE 2-4 GM/100ML-% IV SOLN
2.0000 g | INTRAVENOUS | Status: AC
  Administered 2021-12-29: 2 g via INTRAVENOUS

## 2021-12-29 MED ORDER — PHENYLEPHRINE HCL (PRESSORS) 10 MG/ML IV SOLN
INTRAVENOUS | Status: AC
  Filled 2021-12-29: qty 1

## 2021-12-29 MED ORDER — CHLORHEXIDINE GLUCONATE 0.12 % MT SOLN
OROMUCOSAL | Status: AC
  Administered 2021-12-29: 15 mL via OROMUCOSAL
  Filled 2021-12-29: qty 15

## 2021-12-29 MED ORDER — LIDOCAINE HCL (PF) 2 % IJ SOLN
INTRAMUSCULAR | Status: AC
  Filled 2021-12-29: qty 5

## 2021-12-29 MED ORDER — GLYCOPYRROLATE 0.2 MG/ML IJ SOLN
INTRAMUSCULAR | Status: DC | PRN
  Administered 2021-12-29: .1 mg via INTRAVENOUS

## 2021-12-29 MED ORDER — ACETAMINOPHEN 10 MG/ML IV SOLN: 1000.0000 mg | Freq: Once | INTRAVENOUS | Status: DC | PRN

## 2021-12-29 MED ORDER — FENTANYL CITRATE (PF) 100 MCG/2ML IJ SOLN: 25.0000 ug | INTRAMUSCULAR | Status: DC | PRN

## 2021-12-29 MED ORDER — CHLORHEXIDINE GLUCONATE 0.12 % MT SOLN: 15.0000 mL | Freq: Once | OROMUCOSAL | Status: AC

## 2021-12-29 MED ORDER — PROPOFOL 10 MG/ML IV BOLUS
INTRAVENOUS | Status: AC
  Filled 2021-12-29: qty 20

## 2021-12-29 MED ORDER — ACETAMINOPHEN 10 MG/ML IV SOLN
INTRAVENOUS | Status: DC | PRN
  Administered 2021-12-29: 1000 mg via INTRAVENOUS

## 2021-12-29 MED ORDER — DROPERIDOL 2.5 MG/ML IJ SOLN
0.6250 mg | Freq: Once | INTRAMUSCULAR | Status: DC | PRN
  Filled 2021-12-29: qty 2

## 2021-12-29 MED ORDER — MIDAZOLAM HCL 2 MG/2ML IJ SOLN
INTRAMUSCULAR | Status: DC | PRN
Start: 2021-12-29 — End: 2021-12-29
  Administered 2021-12-29: 2 mg via INTRAVENOUS

## 2021-12-29 MED ORDER — OXYCODONE-ACETAMINOPHEN 5-325 MG PO TABS: 1.0000 | ORAL_TABLET | ORAL | 0 refills | Status: AC | PRN

## 2021-12-29 MED ORDER — SODIUM CHLORIDE FLUSH 0.9 % IV SOLN
INTRAVENOUS | Status: AC
  Filled 2021-12-29: qty 20

## 2021-12-29 MED ORDER — CEFAZOLIN SODIUM-DEXTROSE 2-4 GM/100ML-% IV SOLN
INTRAVENOUS | Status: AC
  Filled 2021-12-29: qty 100

## 2021-12-29 MED ORDER — ACETAMINOPHEN 10 MG/ML IV SOLN
INTRAVENOUS | Status: AC
  Filled 2021-12-29: qty 100

## 2021-12-29 MED ORDER — MIDAZOLAM HCL 2 MG/2ML IJ SOLN
INTRAMUSCULAR | Status: AC
  Filled 2021-12-29: qty 2

## 2021-12-29 MED ORDER — DEXMEDETOMIDINE (PRECEDEX) IN NS 20 MCG/5ML (4 MCG/ML) IV SYRINGE
PREFILLED_SYRINGE | INTRAVENOUS | Status: AC
  Filled 2021-12-29: qty 5

## 2021-12-29 MED ORDER — PROPOFOL 10 MG/ML IV BOLUS
INTRAVENOUS | Status: DC | PRN
  Administered 2021-12-29: 150 mg via INTRAVENOUS
  Administered 2021-12-29: 50 mg via INTRAVENOUS

## 2021-12-29 MED ORDER — SURGIFLO WITH THROMBIN (HEMOSTATIC MATRIX KIT) OPTIME
TOPICAL | Status: DC | PRN
  Administered 2021-12-29: 1 via TOPICAL

## 2021-12-29 MED ORDER — ONDANSETRON HCL 4 MG/2ML IJ SOLN
INTRAMUSCULAR | Status: AC
  Filled 2021-12-29: qty 2

## 2021-12-29 MED ORDER — SENNA 8.6 MG PO TABS: 1.0000 | ORAL_TABLET | Freq: Every day | ORAL | 0 refills | Status: AC | PRN

## 2021-12-29 MED ORDER — GLYCOPYRROLATE 0.2 MG/ML IJ SOLN
INTRAMUSCULAR | Status: AC
  Filled 2021-12-29: qty 1

## 2021-12-29 MED ORDER — DEXAMETHASONE SODIUM PHOSPHATE 10 MG/ML IJ SOLN
INTRAMUSCULAR | Status: DC | PRN
  Administered 2021-12-29: 10 mg via INTRAVENOUS

## 2021-12-29 MED ORDER — DEXAMETHASONE SODIUM PHOSPHATE 10 MG/ML IJ SOLN
INTRAMUSCULAR | Status: AC
  Filled 2021-12-29: qty 1

## 2021-12-29 MED ORDER — FENTANYL CITRATE (PF) 100 MCG/2ML IJ SOLN
INTRAMUSCULAR | Status: DC | PRN
Start: 2021-12-29 — End: 2021-12-29
  Administered 2021-12-29 (×2): 50 ug via INTRAVENOUS

## 2021-12-29 MED ORDER — OXYCODONE HCL 5 MG PO TABS
ORAL_TABLET | ORAL | Status: AC
  Filled 2021-12-29: qty 1

## 2021-12-29 MED ORDER — BUPIVACAINE-EPINEPHRINE (PF) 0.5% -1:200000 IJ SOLN
INTRAMUSCULAR | Status: DC | PRN
  Administered 2021-12-29: 3 mL

## 2021-12-29 MED ORDER — OXYCODONE HCL 5 MG/5ML PO SOLN: 5.0000 mg | Freq: Once | ORAL | Status: AC | PRN

## 2021-12-29 MED ORDER — BUPIVACAINE-EPINEPHRINE (PF) 0.5% -1:200000 IJ SOLN
INTRAMUSCULAR | Status: AC
  Filled 2021-12-29: qty 30

## 2021-12-29 MED ORDER — METHYLPREDNISOLONE ACETATE 40 MG/ML IJ SUSP
INTRAMUSCULAR | Status: AC
  Filled 2021-12-29: qty 1

## 2021-12-29 MED ORDER — ORAL CARE MOUTH RINSE: 15.0000 mL | Freq: Once | OROMUCOSAL | Status: AC

## 2021-12-29 MED ORDER — ONDANSETRON HCL 4 MG/2ML IJ SOLN
INTRAMUSCULAR | Status: DC | PRN
  Administered 2021-12-29: 4 mg via INTRAVENOUS

## 2021-12-29 MED ORDER — FENTANYL CITRATE (PF) 100 MCG/2ML IJ SOLN
INTRAMUSCULAR | Status: AC
  Filled 2021-12-29: qty 2

## 2021-12-29 MED ORDER — SUCCINYLCHOLINE CHLORIDE 200 MG/10ML IV SOSY
PREFILLED_SYRINGE | INTRAVENOUS | Status: DC | PRN
Start: 2021-12-29 — End: 2021-12-29
  Administered 2021-12-29: 100 mg via INTRAVENOUS

## 2021-12-29 MED ORDER — OXYCODONE HCL 5 MG PO TABS
5.0000 mg | ORAL_TABLET | Freq: Once | ORAL | Status: AC | PRN
  Administered 2021-12-29: 5 mg via ORAL

## 2021-12-29 MED ORDER — METHYLPREDNISOLONE ACETATE 40 MG/ML IJ SUSP
INTRAMUSCULAR | Status: DC | PRN
Start: 2021-12-29 — End: 2021-12-29
  Administered 2021-12-29: 40 mg

## 2021-12-29 MED ORDER — DEXMEDETOMIDINE (PRECEDEX) IN NS 20 MCG/5ML (4 MCG/ML) IV SYRINGE
PREFILLED_SYRINGE | INTRAVENOUS | Status: DC | PRN
  Administered 2021-12-29: 4 ug via INTRAVENOUS
  Administered 2021-12-29: 8 ug via INTRAVENOUS
  Administered 2021-12-29: 4 ug via INTRAVENOUS

## 2021-12-29 MED ORDER — LACTATED RINGERS IV SOLN: INTRAVENOUS | Status: DC

## 2021-12-29 MED ORDER — PHENYLEPHRINE 40 MCG/ML (10ML) SYRINGE FOR IV PUSH (FOR BLOOD PRESSURE SUPPORT)
PREFILLED_SYRINGE | INTRAVENOUS | Status: DC | PRN
  Administered 2021-12-29: 160 ug via INTRAVENOUS
  Administered 2021-12-29 (×2): 80 ug via INTRAVENOUS
  Administered 2021-12-29 (×2): 160 ug via INTRAVENOUS

## 2021-12-29 SURGICAL SUPPLY — 53 items
BUR NEURO DRILL SOFT 3.0X3.8M (BURR) ×2 IMPLANT
CHLORAPREP W/TINT 26 (MISCELLANEOUS) ×4 IMPLANT
CNTNR SPEC 2.5X3XGRAD LEK (MISCELLANEOUS) ×1
CONT SPEC 4OZ STER OR WHT (MISCELLANEOUS) ×1
CONTAINER SPEC 2.5X3XGRAD LEK (MISCELLANEOUS) ×1 IMPLANT
COUNTER NEEDLE 20/40 LG (NEEDLE) ×2 IMPLANT
CUP MEDICINE 2OZ PLAST GRAD ST (MISCELLANEOUS) ×4 IMPLANT
DERMABOND ADVANCED (GAUZE/BANDAGES/DRESSINGS) ×1
DERMABOND ADVANCED .7 DNX12 (GAUZE/BANDAGES/DRESSINGS) ×1 IMPLANT
DRAPE C ARM PK CFD 31 SPINE (DRAPES) ×2 IMPLANT
DRAPE LAPAROTOMY 100X77 ABD (DRAPES) ×2 IMPLANT
DRAPE MICROSCOPE SPINE 48X150 (DRAPES) ×2 IMPLANT
DRAPE SURG 17X11 SM STRL (DRAPES) ×2 IMPLANT
DRSG OPSITE POSTOP 3X4 (GAUZE/BANDAGES/DRESSINGS) ×1 IMPLANT
ELECT CAUTERY BLADE TIP 2.5 (TIP) ×2
ELECT EZSTD 165MM 6.5IN (MISCELLANEOUS)
ELECT REM PT RETURN 9FT ADLT (ELECTROSURGICAL) ×2
ELECTRODE CAUTERY BLDE TIP 2.5 (TIP) ×1 IMPLANT
ELECTRODE EZSTD 165MM 6.5IN (MISCELLANEOUS) IMPLANT
ELECTRODE REM PT RTRN 9FT ADLT (ELECTROSURGICAL) ×1 IMPLANT
GAUZE 4X4 16PLY ~~LOC~~+RFID DBL (SPONGE) ×2 IMPLANT
GLOVE SURG SYN 6.5 ES PF (GLOVE) ×10 IMPLANT
GLOVE SURG SYN 6.5 PF PI (GLOVE) ×2 IMPLANT
GLOVE SURG SYN 8.5  E (GLOVE) ×4
GLOVE SURG SYN 8.5 E (GLOVE) ×4 IMPLANT
GLOVE SURG SYN 8.5 PF PI (GLOVE) ×3 IMPLANT
GLOVE SURG UNDER POLY LF SZ6.5 (GLOVE) ×3 IMPLANT
GOWN SRG LRG LVL 4 IMPRV REINF (GOWNS) ×1 IMPLANT
GOWN SRG XL LVL 3 NONREINFORCE (GOWNS) ×1 IMPLANT
GOWN STRL NON-REIN TWL XL LVL3 (GOWNS) ×1
GOWN STRL REIN LRG LVL4 (GOWNS) ×1
GRADUATE 1200CC STRL 31836 (MISCELLANEOUS) ×2 IMPLANT
KIT SPINAL PRONEVIEW (KITS) ×2 IMPLANT
MANIFOLD NEPTUNE II (INSTRUMENTS) ×2 IMPLANT
MARKER SKIN DUAL TIP RULER LAB (MISCELLANEOUS) ×3 IMPLANT
NDL SAFETY ECLIPSE 18X1.5 (NEEDLE) ×1 IMPLANT
NEEDLE HYPO 18GX1.5 SHARP (NEEDLE) ×3
NEEDLE HYPO 22GX1.5 SAFETY (NEEDLE) ×2 IMPLANT
NS IRRIG 1000ML POUR BTL (IV SOLUTION) ×2 IMPLANT
PACK LAMINECTOMY NEURO (CUSTOM PROCEDURE TRAY) ×2 IMPLANT
PAD ARMBOARD 7.5X6 YLW CONV (MISCELLANEOUS) ×2 IMPLANT
SURGIFLO W/THROMBIN 8M KIT (HEMOSTASIS) ×2 IMPLANT
SUT DVC VLOC 3-0 CL 6 P-12 (SUTURE) ×2 IMPLANT
SUT VIC AB 0 CT1 27 (SUTURE) ×1
SUT VIC AB 0 CT1 27XCR 8 STRN (SUTURE) ×1 IMPLANT
SUT VIC AB 2-0 CT1 18 (SUTURE) ×2 IMPLANT
SYR 10ML LL (SYRINGE) ×2 IMPLANT
SYR 20ML LL LF (SYRINGE) ×3 IMPLANT
SYR 30ML LL (SYRINGE) ×5 IMPLANT
SYR 3ML LL SCALE MARK (SYRINGE) ×3 IMPLANT
TOWEL OR 17X26 4PK STRL BLUE (TOWEL DISPOSABLE) ×6 IMPLANT
TUBING CONNECTING 10 (TUBING) ×2 IMPLANT
WATER STERILE IRR 500ML POUR (IV SOLUTION) ×1 IMPLANT

## 2021-12-29 NOTE — Discharge Instructions (Addendum)
?Your surgeon has performed an operation on your lumbar spine (low back) to relieve pressure on one or more nerves. Many times, patients feel better immediately after surgery and can ?overdo it.? Even if you feel well, it is important that you follow these activity guidelines. If you do not let your back heal properly from the surgery, you can increase the chance of a disc herniation and/or return of your symptoms. The following are instructions to help in your recovery once you have been discharged from the hospital. ? ?* Do not take anti-inflammatory medications for 3 days after surgery (naproxen [Aleve], ibuprofen [Advil, Motrin], celecoxib [Celebrex], etc.) ? ?Activity  ?  ?No bending, lifting, or twisting (?BLT?). Avoid lifting objects heavier than 10 pounds (gallon milk jug).  Where possible, avoid household activities that involve lifting, bending, pushing, or pulling such as laundry, vacuuming, grocery shopping, and childcare. Try to arrange for help from friends and family for these activities while your back heals. ? ?Increase physical activity slowly as tolerated.  Taking short walks is encouraged, but avoid strenuous exercise. Do not jog, run, bicycle, lift weights, or participate in any other exercises unless specifically allowed by your doctor. Avoid prolonged sitting, including car rides. ? ?Talk to your doctor before resuming sexual activity. ? ?You should not drive until cleared by your doctor. ? ?Until released by your doctor, you should not return to work or school.  You should rest at home and let your body heal.  ? ?You may shower two days after your surgery.  After showering, lightly dab your incision dry. Do not take a tub bath or go swimming for 3 weeks, or until approved by your doctor at your follow-up appointment. ? ?If you smoke, we strongly recommend that you quit.  Smoking has been proven to interfere with normal healing in your back and will dramatically reduce the success rate of  your surgery. Please contact [facility] (800-QUIT-NOW) and use the resources at www.[facility].com for assistance in stopping smoking. ? ?Surgical Incision ?  ?If you have a dressing on your incision, you may remove it two days after your surgery. Keep your incision area clean and dry. ?Your incision was closed with Dermabond glue. The glue should begin to peel away within about a week. ? ?Diet          ? ? You may return to your usual diet. Be sure to stay hydrated. ? ?When to Contact us ? ?Although your surgery and recovery will likely be uneventful, you may have some residual numbness, aches, and pains in your back and/or legs. This is normal and should improve in the next few weeks. ? ?However, should you experience any of the following, contact us immediately: ?New numbness or weakness ?Pain that is progressively getting worse, and is not relieved by your pain medications or rest ?Bleeding, redness, swelling, pain, or drainage from surgical incision ?Chills or flu-like symptoms ?Fever greater than 101.0 F (38.3 C) ?Problems with bowel or bladder functions ?Difficulty breathing or shortness of breath ?Warmth, tenderness, or swelling in your calf ? ?Contact Information ?During office hours (Monday-Friday 9 am to 5 pm), please call your physician at [phone] ?After hours and weekends, please call [phone] and speak with the answering service, who will contact the doctor on call.  If that fails, call the [facility] Operator at [phone] and ask for the Neurosurgery Resident On Call  ?For a life-threatening emergency, call 911  ? ?AMBULATORY SURGERY  ?DISCHARGE INSTRUCTIONS ? ? ?The drugs that  you were given will stay in your system until tomorrow so for the next 24 hours you should not: ? ?Drive an automobile ?Make any legal decisions ?Drink any alcoholic beverage ? ? ?You may resume regular meals tomorrow.  Today it is better to start with liquids and gradually work up to solid foods. ? ?You may eat anything  you prefer, but it is better to start with liquids, then soup and crackers, and gradually work up to solid foods. ? ? ?Please notify your doctor immediately if you have any unusual bleeding, trouble breathing, redness and pain at the surgery site, drainage, fever, or pain not relieved by medication. ? ? ? ?Additional Instructions: ? ? ?Please contact your physician with any problems or Same Day Surgery at [phone], Monday through Friday 6 am to 4 pm, or [place] [facility] number at [phone].  ?

## 2021-12-29 NOTE — Op Note (Signed)
Indications: Joanna Ford is a 51 yo female who presented with : ? 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 ?She failed conservative management prompting surgical intervention ? ?Findings: synovial cyst ? 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 ? ?EBL: 20 ml ?IVF: 1200 ml ?Drains: none ?Disposition: Extubated and Stable to PACU ?Complications: none ? ?No foley catheter was placed. ? ? ?Preoperative Note:  ? ?Risks of surgery discussed include: infection, bleeding, stroke, coma, death, paralysis, CSF leak, nerve/spinal cord injury, numbness, tingling, weakness, complex regional pain syndrome, recurrent stenosis and/or disc herniation, vascular injury, development of instability, neck/back pain, need for further surgery, persistent symptoms, development of deformity, and the risks of anesthesia. The patient understood these risks and agreed to proceed. ? ?Operative Note:  ? ?1. R L4-5 laminoforaminotomy ? ?The patient was then brought from the preoperative center with intravenous access established.  The patient underwent general anesthesia and endotracheal tube intubation, and was then rotated on the [place] rail top where all pressure points were appropriately padded.  The skin was then thoroughly cleansed.  Perioperative antibiotic prophylaxis was administered.  Sterile prep and drapes were then applied and a timeout was then observed.  C-arm was brought into the field under sterile conditions and under lateral visualization the L4-5 interspace was identified and marked. ? ?The incision was marked on the right and injected with local anesthetic. Once this was complete a 2 cm incision was opened with the use of a #10 blade knife.   ? ?The metrx tubes were sequentially advanced and confirmed in position at L4-5. An 56mm by 21mm tube was locked in place to the bed side attachment.  The microscope  was then sterilely brought into the field and muscle creep was hemostased with a bipolar and resected with a pituitary rongeur.  A Bovie extender was then used to expose the spinous process and lamina.  Careful attention was placed to not violate the facet capsule. A 3 mm matchstick drill bit was then used to make a hemi-laminotomy trough until the ligamentum flavum was exposed.  This was extended to the base of the spinous process.  Once this was complete and the underlying ligamentum flavum was visualized, it was dissected with a curette and resected with Kerrison rongeurs.  Extensive ligamentum hypertrophy was noted, requiring a substantial amount of time and care for removal.  The dura was identified and palpated. The kerrison rongeur was then used to remove the medial facet on the right until no compression was noted.  A synovial cyst was identified, dissected free, and removed in piecemeal fashion. A balltip probe was used to confirm decompression of the ipsilateral L5 nerve root. ? ?No CSF leak was noted. ? ?A Depo-Medrol soaked Gelfoam pledget was placed in the defect.  The wound was copiously irrigated. The tube system was then removed under microscopic visualization and hemostasis was obtained with a bipolar.   ? ?The fascial layer was reapproximated with the use of a 0 Vicryl suture.  Subcutaneous tissue layer was reapproximated using 2-0 Vicryl suture.  3-0 monocryl was placed in subcuticular fashion. The skin was then cleansed and Dermabond was used to close the skin opening.  Patient was then rotated back to the preoperative bed awakened from anesthesia and taken to recovery all counts are correct in this case. ? ?I performed the entire procedure with the assistance of Cooper Render PA as an Pensions consultant. ? ?Joanna Ford.  Izora Ribas MD  ?

## 2021-12-29 NOTE — Progress Notes (Signed)
Pharmacy Antibiotic Note ? ?Joanna Ford is a 51 y.o. female admitted on (Not on file) with surgical prophylaxis.  Pharmacy has been consulted for Cefazolin dosing. ? ?Plan: ?TBW = 102.9 kg  ? ?Cefazolin 2 gm IV X 1 60 min pre-op ordered for 3/1 @ 0500.  ? ?  ? ?No data recorded. ? ?No results for input(s): WBC, CREATININE, LATICACIDVEN, VANCOTROUGH, VANCOPEAK, VANCORANDOM, GENTTROUGH, GENTPEAK, GENTRANDOM, TOBRATROUGH, TOBRAPEAK, TOBRARND, AMIKACINPEAK, AMIKACINTROU, AMIKACIN in the last 168 hours.  ?Estimated Creatinine Clearance: 89.8 mL/min (by C-G formula based on SCr of 0.91 mg/dL).   ? ?No Known Allergies ? ?Antimicrobials this admission: ?  >>  ?  >>  ? ?Dose adjustments this admission: ? ? ?Microbiology results: ? BCx:  ? UCx:   ? Sputum:   ? MRSA PCR:  ? ?Thank you for allowing pharmacy to be a part of this patient?s care. ? ?Joanna Ford D ?12/29/2021 1:38 AM ? ?

## 2021-12-29 NOTE — Discharge Summary (Signed)
Physician Discharge Summary  ?Patient ID: ?Joanna Ford ?MRN: [id number] ?DOB/AGE: 06-14-1971 51 y.o. ? ?Admit date: 12/29/2021 ?Discharge date: 12/29/2021 ? ?Admission Diagnoses: lumbar radiculopathy  ? ?Discharge Diagnoses:  ?Active Problems: ?  * No active hospital problems. * ? ? ?Discharged Condition: good ? ?Hospital Course:  ?Joanna Ford is a 51 y.o presenting with right leg pain s/p right L4-5 laminoforaminotomy. Her interoperative course was uncomplicated. She was monitored post-op and discharged home after ambulating, urinating, and tolerating PO intake. She was give prescriptions for Robaxin, Percocet and Senna.  ? ?Consults: None ? ?Significant Diagnostic Studies: none  ? ?Treatments: surgery: as above. Please see separately dictated operative report for further detail ? ?Discharge Exam: ?Blood pressure 127/74, pulse 86, temperature 98.5 ?F (36.9 ?C), temperature source Oral, resp. rate 16, height 5' 5.5" (1.664 m), weight 102.9 kg, last menstrual period 12/01/2021, SpO2 98 %. ? CN II-XII grossly intact ? 5/5 strength BLE  ?Incision c/d/i ? ?Disposition: Discharge disposition: 01-Home or Self Care ? ? ? ? ? ? ?Discharge Instructions   ? ? Remove dressing in 48 hours   Complete by: As directed ?  ? ?  ? ?Allergies as of 12/29/2021   ?No Known Allergies ?  ? ?  ?Medication List  ?  ? ?TAKE these medications   ? ?busPIRone 5 MG tablet ?Commonly known as: BUSPAR ?Take 5 mg by mouth 2 (two) times daily. ?  ?escitalopram 20 MG tablet ?Commonly known as: LEXAPRO ?Take 20 mg by mouth daily. ?  ?Hyoscyamine Sulfate SL 0.125 MG Subl ?Place 0.125 mg under the tongue every 6 (six) hours as needed for cramping. ?  ?ibuprofen 200 MG tablet ?Commonly known as: ADVIL ?Take 400 mg by mouth every 8 (eight) hours as needed for moderate pain. ?  ?levothyroxine 175 MCG tablet ?Commonly known as: SYNTHROID ?Take 175 mcg by mouth every morning. ?  ?lisinopril-hydrochlorothiazide 20-25 MG tablet ?Commonly known as: ZESTORETIC ?Take 1  tablet by mouth daily. ?  ?loratadine 10 MG tablet ?Commonly known as: CLARITIN ?Take 10 mg by mouth daily as needed for allergies. ?  ?methocarbamol 500 MG tablet ?Commonly known as: Robaxin ?Take 1 tablet (500 mg total) by mouth 4 (four) times daily. ?  ?multivitamin with minerals tablet ?Take 1 tablet by mouth daily. ?  ?naproxen 500 MG tablet ?Commonly known as: NAPROSYN ?Take 500 mg by mouth 2 (two) times daily as needed for moderate pain. ?  ?norethindrone 0.35 MG tablet ?Commonly known as: MICRONOR ?Take 0.35 mg by mouth daily. ?  ?oxyCODONE-acetaminophen 5-325 MG tablet ?Commonly known as: Percocet ?Take 1 tablet by mouth every 4 (four) hours as needed for up to 5 days for severe pain. ?  ?pantoprazole 40 MG tablet ?Commonly known as: PROTONIX ?Take 40 mg by mouth daily. ?  ?rOPINIRole 0.5 MG tablet ?Commonly known as: REQUIP ?Take 0.5 mg by mouth 3 (three) times daily. ?  ?senna 8.6 MG Tabs tablet ?Commonly known as: SENOKOT ?Take 1 tablet (8.6 mg total) by mouth daily as needed for mild constipation. ?  ?Wegovy 0.25 MG/0.5ML Soaj ?Generic drug: Semaglutide-Weight Management ?Inject 0.25 mg into the skin once a week. ?  ? ?  ? ? Follow-up Information   ? ? Loleta Dicker, PA Follow up in 2 week(s).   ?Why: post-op follow up and incision check ?Contact information: ?[place] [address] ?[phone] ? ? ?  ?  ? ?  ?  ? ?  ? ? ?Signed: ?  Loleta Dicker ?12/29/2021, 10:20 AM ? ? ?

## 2021-12-29 NOTE — H&P (Addendum)
History of Present Illness: ?12/29/2021 ?Joanna Ford presents with continued R leg pain.   ? ?10/28/2021 ?Joanna Ford is here today with a chief complaint of right sided low back pain with intermittent posterior right leg pain to the arch of her foot and big toe. ? ?She has been having pain for multiple years but worsening over the past year. She has pain that goes into her right buttock as well as sacroiliac area. She has pain into her right foot. Standing, walking, bending, lifting, and twisting make it worse. Heating pad and naproxen make it better. She has had to make alterations to her work schedule and work environment due to her pain. ? ?Bowel/Bladder Dysfunction: none ? ?Conservative measures:  ?Physical therapy: has participated at [facility] 10/12/2020-11/04/2020 - helped during the visit, but pain returned when not at PT ?Multimodal medical therapy including regular antiinflammatories: gabapentin, naproxen, prednisone  ?Injections: has not had any epidural steroid injections ? ?Past Surgery: none ? ?Joanna Ford has no symptoms of cervical myelopathy. ? ?The symptoms are causing a significant impact on the patient's life.  ? ?Review of Systems:  ?A 10 point review of systems is negative, except for the pertinent positives and negatives detailed in the HPI. ? ?Past Medical History: ?Past Medical History:  ?Diagnosis Date  ? Allergy  ? Depression  ? GERD (gastroesophageal reflux disease)  ? Hypertension  ? Hypothyroidism  ? ?Past Surgical History: ?Past Surgical History:  ?Procedure Laterality Date  ? EGD 07/17/2019  ?Gastritis/No Repeat/MUS  ? COLONOSCOPY 02/18/2021  ?Int. Hemorrhoids/Otherwise normal colon/Repeat 38yrs/CTL  ? TONSILLECTOMY  ? ?No Known Allergies ? ?Current Meds  ?Medication Sig  ? busPIRone (BUSPAR) 5 MG tablet Take 5 mg by mouth 2 (two) times daily.  ? escitalopram (LEXAPRO) 20 MG tablet Take 20 mg by mouth daily.  ? Hyoscyamine Sulfate SL 0.125 MG SUBL Place 0.125 mg under the  tongue every 6 (six) hours as needed for cramping.  ? ibuprofen (ADVIL) 200 MG tablet Take 400 mg by mouth every 8 (eight) hours as needed for moderate pain.  ? levothyroxine (SYNTHROID) 175 MCG tablet Take 175 mcg by mouth every morning.  ? lisinopril-hydrochlorothiazide (ZESTORETIC) 20-25 MG tablet Take 1 tablet by mouth daily.  ? loratadine (CLARITIN) 10 MG tablet Take 10 mg by mouth daily as needed for allergies.  ? Multiple Vitamins-Minerals (MULTIVITAMIN WITH MINERALS) tablet Take 1 tablet by mouth daily.  ? naproxen (NAPROSYN) 500 MG tablet Take 500 mg by mouth 2 (two) times daily as needed for moderate pain.  ? norethindrone (MICRONOR) 0.35 MG tablet Take 0.35 mg by mouth daily.  ? pantoprazole (PROTONIX) 40 MG tablet Take 40 mg by mouth daily.  ? rOPINIRole (REQUIP) 0.5 MG tablet Take 0.5 mg by mouth 3 (three) times daily.  ? ? ? ? ?Social History: ?Social History  ? ?Tobacco Use  ? Smoking status: Never  ? Smokeless tobacco: Never  ?Vaping Use  ? Vaping Use: Never used  ?Substance Use Topics  ? Alcohol use: No  ?Alcohol/week: 0.0 standard drinks  ? Drug use: No  ? ?Family Medical History: ?Family History  ?Problem Relation Age of Onset  ? High blood pressure (Hypertension) Mother  ? Diabetes Mother  ? Hyperlipidemia (Elevated cholesterol) Mother  ? High blood pressure (Hypertension) Father  ? Coronary Artery Disease (Blocked arteries around heart) Father  ? Kidney failure Father  ? Prostate cancer Father  ? ?Physical Examination: ? ?Vitals:  ? 12/29/21 0624  ?BP: 127/74  ?  Pulse: 86  ?Resp: 16  ?Temp: 98.5 ?F (36.9 ?C)  ?SpO2: 98%  ? ?Heart sounds normal no MRG. Chest Clear to Auscultation Bilaterally. ? ?General: Patient is well developed, well nourished, calm, collected, and in no apparent distress. Attention to examination is appropriate. ? ?Psychiatric: Patient is non-anxious. ? ?Head: Pupils equal, round, and reactive to light. ? ?ENT: Oral mucosa appears well hydrated. ? ?Neck: Supple. Full range of  motion. ? ?Respiratory: Patient is breathing without any difficulty. ? ?Extremities: No edema. ? ?Vascular: Palpable dorsal pedal pulses. ? ?Skin: On exposed skin, there are no abnormal skin lesions. ? ?NEUROLOGICAL:  ? ?Awake, alert, oriented to person, place, and time. Speech is clear and fluent. Fund of knowledge is appropriate.  ? ?Cranial Nerves: Pupils equal round and reactive to light. Facial tone is symmetric. Facial sensation is symmetric. Shoulder shrug is symmetric. Tongue protrusion is midline. There is no pronator drift. ? ?ROM of spine: full. Palpation of spine: tender over right SI joint.  ? ?Strength: ?Side Biceps Triceps Deltoid Interossei Grip Wrist Ext. Wrist Flex.  ?R 5 5 5 5 5 5 5   ?L 5 5 5 5 5 5 5   ? ?Side Iliopsoas Quads Hamstring PF DF EHL  ?R 5 5 5 5 5 5   ?L 5 5 5 5 5 5   ? ?Reflexes are 1+ and symmetric at the biceps, triceps, brachioradialis, patella and achilles. Hoffman's is absent.  ?Clonus is not present. Toes are down-going.  ?Bilateral upper and lower extremity sensation is intact to light touch.  ?Gait is antalgic. Positive straight leg raise at 45 degrees on the right ?No evidence of dysmetria noted. ? ?Medical Decision Making ? ?Imaging: ?MRI L spine 05/25/21 ? 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  ?Greater degeneration at L4-5 where there is bilateral anterior  ?synovial cyst that could affect either L5 nerve root at the  ?subarticular recesses.  ? ?Electronically Signed  ?  By: Monte Fantasia M.D.  ?  On: 05/26/2021 21:22 ? ?MRI L spine 11/25/2021 ?IMPRESSION: ?1. Progressive right L5 nerve root impingement due to an enlarging ?right-sided synovial cyst which now measures 10 mm. Severe facet ?degeneration bilaterally at L4-5. Previously noted left-sided ?synovial cyst has resolved. The ?2. Otherwise no significant stenosis. ?  ?  ?Electronically Signed ?  By: Franchot Gallo M.D. ?  On: 11/29/2021 10:46 ? ? ?I have personally reviewed the  images and agree with the above interpretation. ? ?Assessment and Plan: ?Joanna Ford is a pleasant 51 y.o. female with right-sided lumbar radiculopathy that appears to be secondary to synovial cyst at L4-5. This is confirmed on her recent MRI. ? ?She failed conservative management.  Due to her failure, I have recommended laminoforaminotomy and resection of the cyst. ? ? ?Meade Maw MD, MPHS ?Department of Neurosurgery ?

## 2021-12-29 NOTE — Anesthesia Procedure Notes (Signed)
Procedure Name: Intubation ?Date/Time: 12/29/2021 8:38 AM ?Performed by: Cammie Sickle, CRNA ?Pre-anesthesia Checklist: Patient identified, Emergency Drugs available, Suction available and Patient being monitored ?Patient Re-evaluated:Patient Re-evaluated prior to induction ?Oxygen Delivery Method: Circle system utilized ?Preoxygenation: Pre-oxygenation with 100% oxygen ?Induction Type: IV induction ?Ventilation: Mask ventilation without difficulty ?Laryngoscope Size: McGraph and 3 ?Grade View: Grade I ?Tube type: Oral ?Tube size: 7.0 mm ?Number of attempts: 1 ?Airway Equipment and Method: Stylet and Oral airway ?Placement Confirmation: ETT inserted through vocal cords under direct vision, positive ETCO2 and breath sounds checked- equal and bilateral ?Secured at: 21 cm ?Tube secured with: Tape ?Dental Injury: Teeth and Oropharynx as per pre-operative assessment  ? ? ? ? ?

## 2021-12-29 NOTE — Transfer of Care (Signed)
Immediate Anesthesia Transfer of Care Note ? ?Patient: Joanna Ford ? ?Procedure(s) Performed: RIGHT L4-5 LAMINOFORAMINOTOMY (Right: Back) ? ?Patient Location: PACU ? ?Anesthesia Type:General ? ?Level of Consciousness: drowsy ? ?Airway & Oxygen Therapy: Patient Spontanous Breathing and Patient connected to face mask oxygen ? ?Post-op Assessment: Report given to RN and Post -op Vital signs reviewed and stable ? ?Post vital signs: Reviewed and stable ? ?Last Vitals:  ?Vitals Value Taken Time  ?BP 105/53 12/29/21 1018  ?Temp 35.9   ?Pulse 64 12/29/21 1022  ?Resp 15   ?SpO2 96 % 12/29/21 1022  ?Vitals shown include unvalidated device data. ? ?Last Pain:  ?Vitals:  ? 12/29/21 0624  ?TempSrc: Oral  ?PainSc: 0-No pain  ?   ? ?  ? ?Complications: No notable events documented. ?

## 2021-12-29 NOTE — Anesthesia Postprocedure Evaluation (Signed)
Anesthesia Post Note ? ?Patient: Joanna Ford ? ?Procedure(s) Performed: RIGHT L4-5 LAMINOFORAMINOTOMY (Right: Back) ? ?Patient location during evaluation: PACU ?Anesthesia Type: General ?Level of consciousness: awake and alert ?Pain management: pain level controlled ?Vital Signs Assessment: post-procedure vital signs reviewed and stable ?Respiratory status: spontaneous breathing, nonlabored ventilation and respiratory function stable ?Cardiovascular status: blood pressure returned to baseline and stable ?Postop Assessment: no apparent nausea or vomiting ?Anesthetic complications: no ? ? ?No notable events documented. ? ? ?Last Vitals:  ?Vitals:  ? 12/29/21 1115 12/29/21 1130  ?BP: 119/79 133/73  ?Pulse: 100 85  ?Resp: 19 18  ?Temp: (!) 36.2 ?C 36.7 ?C  ?SpO2: 97% 100%  ?  ?Last Pain:  ?Vitals:  ? 12/29/21 1130  ?TempSrc: Temporal  ?PainSc: 4   ? ? ?  ?  ?  ?  ?  ?  ? ?[place] Ouch ? ? ? ? ?

## 2021-12-31 ENCOUNTER — Encounter: Payer: Self-pay | Admitting: Neurosurgery

## 2022-03-02 ENCOUNTER — Other Ambulatory Visit: Payer: Self-pay | Admitting: Internal Medicine

## 2022-03-02 DIAGNOSIS — R252 Cramp and spasm: Secondary | ICD-10-CM

## 2022-03-10 ENCOUNTER — Ambulatory Visit
Admission: RE | Admit: 2022-03-10 | Discharge: 2022-03-10 | Disposition: A | Discharge status: Home / Self Care | Payer: BC Managed Care – PPO | Source: Ambulatory Visit | Attending: Internal Medicine | Admitting: Internal Medicine

## 2022-03-10 DIAGNOSIS — R252 Cramp and spasm: Secondary | ICD-10-CM | POA: Insufficient documentation

## 2022-04-15 ENCOUNTER — Other Ambulatory Visit: Payer: Self-pay | Admitting: Gastroenterology

## 2022-04-15 DIAGNOSIS — R1011 Right upper quadrant pain: Secondary | ICD-10-CM

## 2022-05-05 ENCOUNTER — Ambulatory Visit
Admission: RE | Admit: 2022-05-05 | Discharge: 2022-05-05 | Disposition: A | Discharge status: Home / Self Care | Payer: BC Managed Care – PPO | Source: Ambulatory Visit | Attending: Gastroenterology | Admitting: Gastroenterology

## 2022-05-05 DIAGNOSIS — R1011 Right upper quadrant pain: Secondary | ICD-10-CM

## 2022-07-19 ENCOUNTER — Other Ambulatory Visit: Payer: Self-pay | Admitting: Obstetrics and Gynecology

## 2022-07-19 ENCOUNTER — Ambulatory Visit
Admission: RE | Admit: 2022-07-19 | Discharge: 2022-07-19 | Disposition: A | Discharge status: Home / Self Care | Payer: BC Managed Care – PPO | Source: Ambulatory Visit | Attending: Obstetrics and Gynecology | Admitting: Obstetrics and Gynecology

## 2022-07-19 DIAGNOSIS — Z1231 Encounter for screening mammogram for malignant neoplasm of breast: Secondary | ICD-10-CM | POA: Diagnosis present

## 2022-08-24 ENCOUNTER — Ambulatory Visit: Payer: BC Managed Care – PPO | Admitting: Dermatology

## 2022-08-24 DIAGNOSIS — Z1283 Encounter for screening for malignant neoplasm of skin: Secondary | ICD-10-CM

## 2022-08-24 DIAGNOSIS — L578 Other skin changes due to chronic exposure to nonionizing radiation: Secondary | ICD-10-CM

## 2022-08-24 DIAGNOSIS — L814 Other melanin hyperpigmentation: Secondary | ICD-10-CM

## 2022-08-24 DIAGNOSIS — D239 Other benign neoplasm of skin, unspecified: Secondary | ICD-10-CM

## 2022-08-24 DIAGNOSIS — D2372 Other benign neoplasm of skin of left lower limb, including hip: Secondary | ICD-10-CM

## 2022-08-24 DIAGNOSIS — L719 Rosacea, unspecified: Secondary | ICD-10-CM | POA: Diagnosis not present

## 2022-08-24 DIAGNOSIS — L738 Other specified follicular disorders: Secondary | ICD-10-CM

## 2022-08-24 DIAGNOSIS — D2361 Other benign neoplasm of skin of right upper limb, including shoulder: Secondary | ICD-10-CM

## 2022-08-24 DIAGNOSIS — D229 Melanocytic nevi, unspecified: Secondary | ICD-10-CM

## 2022-08-24 DIAGNOSIS — L821 Other seborrheic keratosis: Secondary | ICD-10-CM

## 2022-08-24 NOTE — Progress Notes (Signed)
New Patient Visit  Subjective  Joanna Ford is a 51 y.o. female who presents for the following: check spot (R arm, ~2yr tender to touch) and check face (Check face for any skin cancers). The patient has spots, moles and lesions to be evaluated, some may be new or changing and the patient has concerns that these could be cancer.  New patient referral from Dr. JHarrel Lemon  The following portions of the chart were reviewed this encounter and updated as appropriate:   Tobacco  Allergies  Meds  Problems  Med Hx  Surg Hx  Fam Hx     Review of Systems:  No other skin or systemic complaints except as noted in HPI or Assessment and Plan.  Objective  Well appearing patient in no apparent distress; mood and affect are within normal limits.  A focused examination was performed including face, arms. Relevant physical exam findings are noted in the Assessment and Plan.  R lat elbow 1.0 x 0.5cm Stuck-on, waxy, tan-brown papule  Head - Anterior (Face) Telangiectasias face  R deltoid, L lat ankle Firm pink/brown papulenodule with dimple sign.    Assessment & Plan   Seborrheic Keratoses - Stuck-on, waxy, tan-brown papules and/or plaques  - Benign-appearing - Discussed benign etiology and prognosis. - Observe - Call for any changes - face  Lentigines - Scattered tan macules - Due to sun exposure - Benign-appearing, observe - Recommend daily broad spectrum sunscreen SPF 30+ to sun-exposed areas, reapply every 2 hours as needed. - Call for any changes   Seborrheic keratosis R lat elbow Benign-appearing.  Observation.  Call clinic for new or changing lesions.  Recommend daily use of broad spectrum spf 30+ sunscreen to sun-exposed areas.  Pt declines treatment today.  Rosacea Head - Anterior (Face) Erythrotelangiectatic  Rosacea  Rosacea is a chronic progressive skin condition usually affecting the face of adults, causing redness and/or acne bumps. It is treatable but  not curable. It sometimes affects the eyes (ocular rosacea) as well. It may respond to topical and/or systemic medication and can flare with stress, sun exposure, alcohol, exercise, topical steroids (including hydrocortisone/cortisone 10) and some foods.  Daily application of broad spectrum spf 30+ sunscreen to face is recommended to reduce flares.  Discussed the treatment option of BBL/laser.  Typically we recommend 1-3 treatment sessions about 5-8 weeks apart for best results.  The patient's condition may require "maintenance treatments" in the future.  The fee for BBL / laser treatments is $350 per treatment session for the whole face.  A fee can be quoted for other parts of the body. Insurance typically does not pay for BBL/laser treatments and therefore the fee is an out-of-pocket cost.  Dermatofibroma R deltoid, L lat ankle A dermatofibroma is a benign growth possibly related to trauma, such as an insect bite or inflamed acne-type bump.  Discussed removal (shave vrs excision) with resulting scar and risk of recurrence.  Since not bothersome, will observe for now.  Sebaceous Hyperplasia face - Small yellow papules with a central dell - Benign - Observe  - face  Melanocytic Nevi - Tan-brown and/or pink-flesh-colored symmetric macules and papules - Benign appearing on exam today - Observation - Call clinic for new or changing moles - Recommend daily use of broad spectrum spf 30+ sunscreen to sun-exposed areas.   - face  Return if symptoms worsen or fail to improve.  I, SOthelia Pulling RMA, am acting as scribe for DSarina Ser MD . Documentation: I have reviewed  the above documentation for accuracy and completeness, and I agree with the above.  Sarina Ser, MD

## 2022-08-24 NOTE — Patient Instructions (Addendum)
Seborrheic Keratosis  What causes seborrheic keratoses? Seborrheic keratoses are harmless, common skin growths that first appear during adult life.  As time goes by, more growths appear.  Some people may develop a large number of them.  Seborrheic keratoses appear on both covered and uncovered body parts.  They are not caused by sunlight.  The tendency to develop seborrheic keratoses can be inherited.  They vary in color from skin-colored to gray, brown, or even black.  They can be either smooth or have a rough, warty surface.   Seborrheic keratoses are superficial and look as if they were stuck on the skin.  Under the microscope this type of keratosis looks like layers upon layers of skin.  That is why at times the top layer may seem to fall off, but the rest of the growth remains and re-grows.    Treatment Seborrheic keratoses do not need to be treated, but can easily be removed in the office.  Seborrheic keratoses often cause symptoms when they rub on clothing or jewelry.  Lesions can be in the way of shaving.  If they become inflamed, they can cause itching, soreness, or burning.  Removal of a seborrheic keratosis can be accomplished by freezing, burning, or surgery. If any spot bleeds, scabs, or grows rapidly, please return to have it checked, as these can be an indication of a skin cancer.   Due to recent changes in healthcare laws, you may see results of your pathology and/or laboratory studies on MyChart before the doctors have had a chance to review them. We understand that in some cases there may be results that are confusing or concerning to you. Please understand that not all results are received at the same time and often the doctors may need to interpret multiple results in order to provide you with the best plan of care or course of treatment. Therefore, we ask that you please give us 2 business days to thoroughly review all your results before contacting the office for clarification.  Should we see a critical lab result, you will be contacted sooner.   If You Need Anything After Your Visit  If you have any questions or concerns for your doctor, please call our main line at [phone] and press option 4 to reach your doctor's medical assistant. If no one answers, please leave a voicemail as directed and we will return your call as soon as possible. Messages left after 4 pm will be answered the following business day.   You may also send us a message via MyChart. We typically respond to MyChart messages within 1-2 business days.  For prescription refills, please ask your pharmacy to contact our office. Our fax number is [phone].  If you have an urgent issue when the clinic is closed that cannot wait until the next business day, you can page your doctor at the number below.    Please note that while we do our best to be available for urgent issues outside of office hours, we are not available 24/7.   If you have an urgent issue and are unable to reach us, you may choose to seek medical care at your doctor's office, retail clinic, urgent care center, or emergency room.  If you have a medical emergency, please immediately call 911 or go to the emergency department.  Pager Numbers  - Dr. Kowalski: [phone]  - Dr. Moye: [phone]  - Dr. Stewart: [phone]  In the event of inclement weather, please call our main line   at [phone] for an update on the status of any delays or closures.  Dermatology Medication Tips: Please keep the boxes that topical medications come in in order to help keep track of the instructions about where and how to use these. Pharmacies typically print the medication instructions only on the boxes and not directly on the medication tubes.   If your medication is too expensive, please contact our office at [phone] option 4 or send us a message through MyChart.   We are unable to tell what your co-pay for medications will be  in advance as this is different depending on your insurance coverage. However, we may be able to find a substitute medication at lower cost or fill out paperwork to get insurance to cover a needed medication.   If a prior authorization is required to get your medication covered by your insurance company, please allow us 1-2 business days to complete this process.  Drug prices often vary depending on where the prescription is filled and some pharmacies may offer cheaper prices.  The website www.goodrx.com contains coupons for medications through different pharmacies. The prices here do not account for what the cost may be with help from insurance (it may be cheaper with your insurance), but the website can give you the price if you did not use any insurance.  - You can print the associated coupon and take it with your prescription to the pharmacy.  - You may also stop by our office during regular business hours and pick up a GoodRx coupon card.  - If you need your prescription sent electronically to a different pharmacy, notify our office through [facility] MyChart or by phone at [phone] option 4.     Si Usted Necesita Algo Despus de Su Visita  Tambin puede enviarnos un mensaje a travs de MyChart. Por lo general respondemos a los mensajes de MyChart en el transcurso de 1 a 2 das hbiles.  Para renovar recetas, por favor pida a su farmacia que se ponga en contacto con nuestra oficina. Nuestro nmero de fax es el [phone].  Si tiene un asunto urgente cuando la clnica est cerrada y que no puede esperar hasta el siguiente da hbil, puede llamar/localizar a su doctor(a) al nmero que aparece a continuacin.   Por favor, tenga en cuenta que aunque hacemos todo lo posible para estar disponibles para asuntos urgentes fuera del horario de oficina, no estamos disponibles las 24 horas del da, los 7 das de la semana.   Si tiene un problema urgente y no puede comunicarse con nosotros,  puede optar por buscar atencin mdica  en el consultorio de su doctor(a), en una clnica privada, en un centro de atencin urgente o en una sala de emergencias.  Si tiene una emergencia mdica, por favor llame inmediatamente al 911 o vaya a la sala de emergencias.  Nmeros de bper  - Dr. Kowalski: [phone]  - Dra. Moye: [phone]  - Dra. Stewart: [phone]  En caso de inclemencias del tiempo, por favor llame a nuestra lnea principal al [phone] para una actualizacin sobre el estado de cualquier retraso o cierre.  Consejos para la medicacin en dermatologa: Por favor, guarde las cajas en las que vienen los medicamentos de uso tpico para ayudarle a seguir las instrucciones sobre dnde y cmo usarlos. Las farmacias generalmente imprimen las instrucciones del medicamento slo en las cajas y no directamente en los tubos del medicamento.   Si su medicamento es muy caro, por favor, pngase en contacto   con nuestra oficina llamando al [phone] y presione la opcin 4 o envenos un mensaje a travs de MyChart.   No podemos decirle cul ser su copago por los medicamentos por adelantado ya que esto es diferente dependiendo de la cobertura de su seguro. Sin embargo, es posible que podamos encontrar un medicamento sustituto a menor costo o llenar un formulario para que el seguro cubra el medicamento que se considera necesario.   Si se requiere una autorizacin previa para que su compaa de seguros cubra su medicamento, por favor permtanos de 1 a 2 das hbiles para completar este proceso.  Los precios de los medicamentos varan con frecuencia dependiendo del lugar de dnde se surte la receta y alguna farmacias pueden ofrecer precios ms baratos.  El sitio web www.goodrx.com tiene cupones para medicamentos de diferentes farmacias. Los precios aqu no tienen en cuenta lo que podra costar con la ayuda del seguro (puede ser ms barato con su seguro), pero el sitio web puede darle el  precio si no utiliz ningn seguro.  - Puede imprimir el cupn correspondiente y llevarlo con su receta a la farmacia.  - Tambin puede pasar por nuestra oficina durante el horario de atencin regular y recoger una tarjeta de cupones de GoodRx.  - Si necesita que su receta se enve electrnicamente a una farmacia diferente, informe a nuestra oficina a travs de MyChart de [facility] o por telfono llamando al [phone] y presione la opcin 4.  

## 2022-08-29 ENCOUNTER — Encounter: Payer: Self-pay | Admitting: Dermatology

## 2023-04-19 ENCOUNTER — Other Ambulatory Visit: Payer: Self-pay | Admitting: Gastroenterology

## 2023-04-19 DIAGNOSIS — K760 Fatty (change of) liver, not elsewhere classified: Secondary | ICD-10-CM

## 2023-04-26 ENCOUNTER — Ambulatory Visit
Admission: RE | Admit: 2023-04-26 | Discharge: 2023-04-26 | Disposition: A | Discharge status: Home / Self Care | Payer: BC Managed Care – PPO | Source: Ambulatory Visit | Attending: Gastroenterology | Admitting: Gastroenterology

## 2023-04-26 DIAGNOSIS — K760 Fatty (change of) liver, not elsewhere classified: Secondary | ICD-10-CM

## 2023-07-24 ENCOUNTER — Other Ambulatory Visit: Payer: Self-pay | Admitting: Obstetrics

## 2023-07-24 DIAGNOSIS — Z1231 Encounter for screening mammogram for malignant neoplasm of breast: Secondary | ICD-10-CM

## 2023-08-02 ENCOUNTER — Ambulatory Visit
Admission: RE | Admit: 2023-08-02 | Discharge: 2023-08-02 | Disposition: A | Discharge status: Home / Self Care | Payer: BC Managed Care – PPO | Source: Ambulatory Visit | Attending: Obstetrics | Admitting: Obstetrics

## 2023-08-02 DIAGNOSIS — Z1231 Encounter for screening mammogram for malignant neoplasm of breast: Secondary | ICD-10-CM | POA: Diagnosis present

## 2024-08-12 ENCOUNTER — Other Ambulatory Visit: Payer: Self-pay | Admitting: Obstetrics

## 2024-08-12 DIAGNOSIS — Z1231 Encounter for screening mammogram for malignant neoplasm of breast: Secondary | ICD-10-CM

## 2024-08-28 ENCOUNTER — Other Ambulatory Visit: Payer: Self-pay | Admitting: Gastroenterology

## 2024-08-28 DIAGNOSIS — K760 Fatty (change of) liver, not elsewhere classified: Secondary | ICD-10-CM

## 2024-09-11 ENCOUNTER — Ambulatory Visit
Admission: RE | Admit: 2024-09-11 | Discharge: 2024-09-11 | Disposition: A | Discharge status: Home / Self Care | Source: Ambulatory Visit | Attending: Obstetrics | Admitting: Obstetrics

## 2024-09-11 DIAGNOSIS — Z1231 Encounter for screening mammogram for malignant neoplasm of breast: Secondary | ICD-10-CM | POA: Diagnosis present

## 2024-09-13 ENCOUNTER — Encounter: Payer: Self-pay | Admitting: Obstetrics

## 2024-09-16 ENCOUNTER — Other Ambulatory Visit: Payer: Self-pay | Admitting: Obstetrics

## 2024-09-16 DIAGNOSIS — R928 Other abnormal and inconclusive findings on diagnostic imaging of breast: Secondary | ICD-10-CM

## 2024-09-17 ENCOUNTER — Ambulatory Visit
Admission: RE | Admit: 2024-09-17 | Discharge: 2024-09-17 | Disposition: A | Discharge status: Home / Self Care | Source: Ambulatory Visit | Attending: Obstetrics | Admitting: Obstetrics

## 2024-09-17 ENCOUNTER — Ambulatory Visit
Admission: RE | Admit: 2024-09-17 | Discharge: 2024-09-17 | Disposition: A | Discharge status: Home / Self Care | Source: Ambulatory Visit | Attending: Gastroenterology | Admitting: Gastroenterology

## 2024-09-17 DIAGNOSIS — R928 Other abnormal and inconclusive findings on diagnostic imaging of breast: Secondary | ICD-10-CM

## 2024-09-17 DIAGNOSIS — K760 Fatty (change of) liver, not elsewhere classified: Secondary | ICD-10-CM | POA: Diagnosis present
